# Patient Record
Sex: Female | Born: 1985
Health system: Southern US, Community
[De-identification: ages and names within clinical notes are randomized; demographics above are authoritative.]

## PROBLEM LIST (undated history)

## (undated) ENCOUNTER — Inpatient Hospital Stay (HOSPITAL_COMMUNITY): Payer: Self-pay

## (undated) DIAGNOSIS — R0789 Other chest pain: Secondary | ICD-10-CM

## (undated) DIAGNOSIS — M549 Dorsalgia, unspecified: Secondary | ICD-10-CM

## (undated) DIAGNOSIS — F419 Anxiety disorder, unspecified: Secondary | ICD-10-CM

## (undated) DIAGNOSIS — F319 Bipolar disorder, unspecified: Secondary | ICD-10-CM

## (undated) DIAGNOSIS — E785 Hyperlipidemia, unspecified: Secondary | ICD-10-CM

## (undated) DIAGNOSIS — C801 Malignant (primary) neoplasm, unspecified: Secondary | ICD-10-CM

## (undated) DIAGNOSIS — N390 Urinary tract infection, site not specified: Secondary | ICD-10-CM

## (undated) DIAGNOSIS — T8859XA Other complications of anesthesia, initial encounter: Secondary | ICD-10-CM

## (undated) DIAGNOSIS — J45909 Unspecified asthma, uncomplicated: Secondary | ICD-10-CM

## (undated) DIAGNOSIS — F32A Depression, unspecified: Secondary | ICD-10-CM

## (undated) HISTORY — PX: TONSILLECTOMY: SUR1361

## (undated) HISTORY — DX: Other chest pain: R07.89

## (undated) HISTORY — DX: Hyperlipidemia, unspecified: E78.5

---

## 2015-05-28 ENCOUNTER — Emergency Department (HOSPITAL_COMMUNITY)
Admission: EM | Admit: 2015-05-28 | Discharge: 2015-05-28 | Disposition: A | Discharge status: Home / Self Care | Payer: Self-pay | Attending: Emergency Medicine | Admitting: Emergency Medicine

## 2015-05-28 ENCOUNTER — Encounter (HOSPITAL_COMMUNITY): Payer: Self-pay

## 2015-05-28 DIAGNOSIS — J45909 Unspecified asthma, uncomplicated: Secondary | ICD-10-CM | POA: Insufficient documentation

## 2015-05-28 DIAGNOSIS — F4321 Adjustment disorder with depressed mood: Secondary | ICD-10-CM | POA: Insufficient documentation

## 2015-05-28 DIAGNOSIS — F410 Panic disorder [episodic paroxysmal anxiety] without agoraphobia: Secondary | ICD-10-CM | POA: Insufficient documentation

## 2015-05-28 DIAGNOSIS — Z79899 Other long term (current) drug therapy: Secondary | ICD-10-CM | POA: Insufficient documentation

## 2015-05-28 DIAGNOSIS — F329 Major depressive disorder, single episode, unspecified: Secondary | ICD-10-CM | POA: Insufficient documentation

## 2015-05-28 HISTORY — DX: Dorsalgia, unspecified: M54.9

## 2015-05-28 HISTORY — DX: Unspecified asthma, uncomplicated: J45.909

## 2015-05-28 MED ORDER — ALPRAZOLAM 0.25 MG PO TABS: 0.2500 mg | ORAL_TABLET | Freq: Two times a day (BID) | ORAL | Status: DC | PRN

## 2015-05-28 MED ORDER — ALBUTEROL SULFATE HFA 108 (90 BASE) MCG/ACT IN AERS
2.0000 | INHALATION_SPRAY | Freq: Once | RESPIRATORY_TRACT | Status: AC
  Administered 2015-05-28: 2 via RESPIRATORY_TRACT
  Filled 2015-05-28: qty 6.7

## 2015-05-28 NOTE — Discharge Instructions (Signed)
Take xanax as needed for anxiety.   You should talk to your friends and family and you need social support.   You need to see primary care doctor.   Return to ER if you have worse depression, thoughts of harming yourself or others.  Substance Abuse Treatment Programs  Intensive Outpatient Programs [facility]     [address], [place]       [facility] [place] [address] [place], [place]  [facility]     (Inpatient and outpatient)     [address]           [place] [phone] (Suboxone and Methadone)  [place], [place] [address]      [place] [address] [phone] [place], [place]  [facility] (Outpatient/Inpatient, Chemical)    (insurance only) [phone]             [facility] ([place]) [place], [place]     [facility]     [address]     [place], [place]       [facility] (for caregivers and family) [phone] [address] Kristeen Mans. [place], [place]      Residential Treatment Programs [facility]      [address] [place], [place] [address]  [phone]       [facility] [address] [place], [place] [address] [phone]  [facility] [place]        [phone]       [facility] [phone]  [facility] ([place]             [place], [place] or [place] of [place], [address] [phone]  [facility] Cut Off    [address]      [place], [place]       [facility] [address] [place],  [place]  [place]   [address] [place], [place] [address]     [phone]      Admissions: 8am-3pm M-F  [facility] (RTS) [address] [place], [place]  [facility] Program: Residential Program [phone] Days)   [place], [place] or [phone]     ADAT[facility]: [facility] [place], [place] (Walk in Hours over the weekend or  by referral)  [facility] [place], [place], [place] [address] [phone]  Crisis Mobile: [facility]:  [phone] (for crisis response 24 hours a day) [facility] Hotline:      [phone] Outpatient Psychiatry and Counseling  [facility]: Mobile Crisis Management 24 hours:  [phone]  [facility] of the [employer] sliding scale fee and walk in schedule: M-F 8am-12pm/1pm-3pm [place], [place] [address] [place], [place] [address] [phone]  [facility] ([facility])- new patient walk-in appointments available Monday - Friday 8am -3pm.          [address] [place], [place] [address] [phone] or crisis line- [place] [facility]/ Intensive Outpatient Therapy Program [place], [place] [address] [place]      [phone] [address], [place] [address]                 [place]   [facility] [phone]. [place], [place] [address]   [employer] [facility]          [address] Johnette Abraham  [place], [place] [address]       [phone]  [facility] [address], [place], [place] [address] [phone]  [facility]    [address] [place], [place] [address]     [place], [place] Joycelyn Man     [place] [address]     [phone]       [facility] [place] [address]  [facility]     [address], [place], MD [place], [place] [address] [address]      [address]     [place], [place] [address]     [phone]       [facility] [address] [place], [place] [address] [phone] Resources for Temporary Residential Assistance/Crisis [facility]) M-F 8am-3pm   [address] [place], [place] [address]   [phone] Services include: laundry, barbering, support groups, case management, phone  & computer access, showers, AA/NA mtgs, mental health/substance abuse nurse, job skills class, disability information, VA assistance, spiritual classes, etc.   [facility]   [address] [place]     [place]              [employer] (women and children)       [address] [place], [place] [address] [phone] [email or web address] for application and process Application Required  [facility]   [address]    [place] [address]     [phone]  [place], [place] [address] [phone]([email or web address] application appt.) Application Required  [facility] (women only)    [address]     [place], [place] [address]     [phone]      Intake starts 6pm daily Need valid ID, SSC, & Police report [employer] [address] [place], [place] [phone] Application  Required  [employer] (men only)     [place].      [place], [place]       [place] (Pregnant women only) [address] [place], [place]  [facility]      [place] [address].      [place], [place] [address]     [phone]             [facility] [address] [place], [place] 90 day commitment/SA/Application process  [facility](men only)     [address]     [place], [place]       Check-in at [facility] [address] [place], [place] [address] [phone] Men/Women/Women and Children must be there by 7 pm  [place], [place]
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
## 2015-11-27 ENCOUNTER — Emergency Department (HOSPITAL_COMMUNITY)
Admission: EM | Admit: 2015-11-27 | Discharge: 2015-11-28 | Disposition: A | Discharge status: Home / Self Care | Payer: Managed Care, Other (non HMO) | Attending: Emergency Medicine | Admitting: Emergency Medicine

## 2015-11-27 ENCOUNTER — Emergency Department (HOSPITAL_COMMUNITY): Payer: Managed Care, Other (non HMO)

## 2015-11-27 ENCOUNTER — Encounter (HOSPITAL_COMMUNITY): Payer: Self-pay | Admitting: *Deleted

## 2015-11-27 DIAGNOSIS — R1030 Lower abdominal pain, unspecified: Secondary | ICD-10-CM | POA: Diagnosis not present

## 2015-11-27 DIAGNOSIS — Z79899 Other long term (current) drug therapy: Secondary | ICD-10-CM | POA: Insufficient documentation

## 2015-11-27 DIAGNOSIS — J45909 Unspecified asthma, uncomplicated: Secondary | ICD-10-CM | POA: Insufficient documentation

## 2015-11-27 HISTORY — DX: Malignant (primary) neoplasm, unspecified: C80.1

## 2015-11-27 LAB — URINALYSIS, ROUTINE W REFLEX MICROSCOPIC
Bilirubin Urine: NEGATIVE
GLUCOSE, UA: NEGATIVE mg/dL
Hgb urine dipstick: NEGATIVE
Ketones, ur: NEGATIVE mg/dL
LEUKOCYTES UA: NEGATIVE
Nitrite: NEGATIVE
PH: 6 (ref 5.0–8.0)
PROTEIN: NEGATIVE mg/dL
SPECIFIC GRAVITY, URINE: 1.035 — AB (ref 1.005–1.030)

## 2015-11-27 LAB — COMPREHENSIVE METABOLIC PANEL
ALT: 13 U/L — ABNORMAL LOW (ref 14–54)
AST: 16 U/L (ref 15–41)
Albumin: 4.3 g/dL (ref 3.5–5.0)
Alkaline Phosphatase: 40 U/L (ref 38–126)
Anion gap: 6 (ref 5–15)
BUN: 11 mg/dL (ref 6–20)
CHLORIDE: 107 mmol/L (ref 101–111)
CO2: 23 mmol/L (ref 22–32)
Calcium: 9.5 mg/dL (ref 8.9–10.3)
Creatinine, Ser: 0.81 mg/dL (ref 0.44–1.00)
Glucose, Bld: 92 mg/dL (ref 65–99)
POTASSIUM: 4.2 mmol/L (ref 3.5–5.1)
SODIUM: 136 mmol/L (ref 135–145)
Total Bilirubin: 0.6 mg/dL (ref 0.3–1.2)
Total Protein: 6.7 g/dL (ref 6.5–8.1)

## 2015-11-27 LAB — CBC
HEMATOCRIT: 39 % (ref 36.0–46.0)
HEMOGLOBIN: 13.7 g/dL (ref 12.0–15.0)
MCH: 30.2 pg (ref 26.0–34.0)
MCHC: 35.1 g/dL (ref 30.0–36.0)
MCV: 86.1 fL (ref 78.0–100.0)
PLATELETS: 279 10*3/uL (ref 150–400)
RBC: 4.53 MIL/uL (ref 3.87–5.11)
RDW: 13.2 % (ref 11.5–15.5)
WBC: 12.7 10*3/uL — AB (ref 4.0–10.5)

## 2015-11-27 LAB — I-STAT BETA HCG BLOOD, ED (MC, WL, AP ONLY): I-stat hCG, quantitative: <5 m[IU]/mL (ref <5)

## 2015-11-27 LAB — LIPASE, BLOOD: LIPASE: 36 U/L (ref 11–51)

## 2015-11-27 MED ORDER — IOPAMIDOL (ISOVUE-300) INJECTION 61%
INTRAVENOUS | Status: AC
  Administered 2015-11-27: 100 mL
  Filled 2015-11-27: qty 100

## 2015-11-27 MED ORDER — SODIUM CHLORIDE 0.9 % IV SOLN
1000.0000 mL | INTRAVENOUS | Status: DC
  Administered 2015-11-27: 1000 mL via INTRAVENOUS

## 2015-11-27 MED ORDER — ONDANSETRON HCL 4 MG/2ML IJ SOLN
4.0000 mg | Freq: Once | INTRAMUSCULAR | Status: AC
  Administered 2015-11-27: 4 mg via INTRAVENOUS
  Filled 2015-11-27: qty 2

## 2015-11-27 MED ORDER — SODIUM CHLORIDE 0.9 % IV SOLN
1000.0000 mL | Freq: Once | INTRAVENOUS | Status: AC
  Administered 2015-11-27: 1000 mL via INTRAVENOUS

## 2015-11-27 MED ORDER — MORPHINE SULFATE (PF) 4 MG/ML IV SOLN
4.0000 mg | Freq: Once | INTRAVENOUS | Status: AC
  Administered 2015-11-27: 4 mg via INTRAVENOUS
  Filled 2015-11-27: qty 1

## 2015-11-27 NOTE — ED Provider Notes (Signed)
[facility] DEPT Provider Note   CSN: [id number] Arrival date & time: 11/27/15  1831   By signing my name below, I, Delton Prairie, attest that this documentation has been prepared under the direction and in the presence of Jola Schmidt, MD  Electronically Signed: Delton Prairie, ED Scribe. 11/27/15. 11:36 PM.   History   Chief Complaint Chief Complaint  Patient presents with  . Abdominal Pain   The history is provided by the patient. No language interpreter was used.   HPI Comments:  Chelsea [name] is a 30 y.o. female, with a hx of cervical cancer, who presents to the Emergency Department complaining of gradually worsening lower abdominal pain x several months. Her pain is exacerbated to the touch and she notes intermittent shooting pain. She notes her pain is tolerable when she uses a pillow to support her back. Pt was sent to ED from [facility]. She states her symptoms worsened x 1 month. Pt states she has been having irregular menstrual cycles. She notes intermittent pain with urinating, intermittent pain with intercourse, nausea, vomiting, diarrhea, fevers and diaphoresis. Pt notes her last episode of emesis was at 5 AM today. Partner notes her nausea and vomiting is exacerbated when eating certain foods. She denies vaginal discharge, vaginal itching, upper abdominal pain and hx of abdominal surgery. No alleviating factors noted. Pt denies any other symptoms or complaints at this time.  Past Medical History:  Diagnosis Date  . Asthma   . Back pain     There are no active problems to display for this patient.   Past Surgical History:  Procedure Laterality Date  . TONSILLECTOMY      OB History    No data available       Home Medications    Prior to Admission medications   Medication Sig Start Date End Date Taking? Authorizing Provider  albuterol (PROVENTIL HFA;VENTOLIN HFA) 108 (90 Base) MCG/ACT inhaler Inhale 2 puffs into the lungs every 6 (six) hours as needed for  wheezing or shortness of breath.   Yes Historical Provider, MD  ALPRAZolam (XANAX) 0.25 MG tablet Take 1 tablet (0.25 mg total) by mouth 2 (two) times daily as needed for anxiety. 05/28/15  Yes Drenda Freeze, MD    Family History History reviewed. No pertinent family history.  Social History Social History  Substance Use Topics  . Smoking status: Never Smoker  . Smokeless tobacco: Not on file  . Alcohol use No     Allergies   Morphine and related   Review of Systems Review of Systems  Constitutional: Positive for diaphoresis and fever.  Gastrointestinal: Positive for abdominal pain, diarrhea, nausea and vomiting.  Genitourinary: Positive for dyspareunia and menstrual problem. Negative for vaginal discharge.  10 systems reviewed and all are negative for acute change except as noted in the HPI.  Physical Exam Updated Vital Signs BP 113/59 (BP Location: Left Arm)   Pulse (!) 55   Temp 98.5 F (36.9 C) (Oral)   Resp 19   SpO2 100%   Physical Exam  Constitutional: She is oriented to person, place, and time. She appears well-developed and well-nourished. No distress.  HENT:  Head: Normocephalic and atraumatic.  Eyes: EOM are normal.  Neck: Normal range of motion.  Cardiovascular: Normal rate, regular rhythm and normal heart sounds.   Pulmonary/Chest: Effort normal and breath sounds normal.  Abdominal: Soft. She exhibits no distension. There is no tenderness.  Diffuse lower abdominal tenderness  Musculoskeletal: Normal range of motion.  Neurological:  She is alert and oriented to person, place, and time.  Skin: Skin is warm and dry.  Psychiatric: She has a normal mood and affect. Judgment normal.  Nursing note and vitals reviewed.    ED Treatments / Results  DIAGNOSTIC STUDIES:  Oxygen Saturation is 100% on RA, normal by my interpretation.    COORDINATION OF CARE:  11:24 PM Discussed treatment plan with pt at bedside and pt agreed to plan.  Labs (all labs  ordered are listed, but only abnormal results are displayed) Labs Reviewed  COMPREHENSIVE METABOLIC PANEL - Abnormal; Notable for the following:       Result Value   ALT 13 (*)    All other components within normal limits  CBC - Abnormal; Notable for the following:    WBC 12.7 (*)    All other components within normal limits  URINALYSIS, ROUTINE W REFLEX MICROSCOPIC (NOT AT [facility]) - Abnormal; Notable for the following:    APPearance HAZY (*)    Specific Gravity, Urine 1.035 (*)    All other components within normal limits  LIPASE, BLOOD  I-STAT BETA HCG BLOOD, ED ([facility], [facility], [facility] ONLY)    EKG  EKG Interpretation None       Radiology No results found.  Procedures Procedures (including critical care time)  Medications Ordered in ED Medications - No data to display   Initial Impression / Assessment and Plan / ED Course  I have reviewed the triage vital signs and the nursing notes.  Pertinent labs & imaging results that were available during my care of the patient were reviewed by me and considered in my medical decision making (see chart for details).  Clinical Course    Patient is overall well-appearing.  CT scan without acute pathology.  Given her ongoing lower abdominal pain she'll need follow-up with a gynecologist for workup of lower abdominal and pelvic pain.  I do not think she needs an acute pelvic examination today she will need follow-up, and a full GYN evaluation.  She has no vaginal discharge.  This could represent endometritis.  Final Clinical Impressions(s) / ED Diagnoses   Final diagnoses:  Lower abdominal pain    New Prescriptions New Prescriptions   ONDANSETRON (ZOFRAN ODT) 8 MG DISINTEGRATING TABLET    Take 1 tablet (8 mg total) by mouth every 8 (eight) hours as needed for nausea or vomiting.   I personally performed the services described in this documentation, which was scribed in my presence. The recorded information has been reviewed and is accurate.         Jola Schmidt, MD 11/28/15 0040

## 2015-11-27 NOTE — ED Triage Notes (Signed)
Pt sent here from [facility] md for RLQ and LLQ pain for extended amount of time with n/v/d. Had elevated WBC. No acute distress noted at triage.

## 2015-11-27 NOTE — ED Notes (Signed)
Patient transported to CT scan . 

## 2015-11-28 MED ORDER — ONDANSETRON 8 MG PO TBDP: 8.0000 mg | ORAL_TABLET | Freq: Three times a day (TID) | ORAL | 0 refills | Status: DC | PRN

## 2015-11-28 NOTE — Discharge Instructions (Signed)
Please call a gynecologist for follow-up
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
## 2016-02-09 ENCOUNTER — Emergency Department (HOSPITAL_COMMUNITY)
Admission: EM | Admit: 2016-02-09 | Discharge: 2016-02-10 | Disposition: A | Discharge status: Home / Self Care | Payer: Managed Care, Other (non HMO) | Attending: Emergency Medicine | Admitting: Emergency Medicine

## 2016-02-09 ENCOUNTER — Encounter (HOSPITAL_COMMUNITY): Payer: Self-pay | Admitting: Emergency Medicine

## 2016-02-09 DIAGNOSIS — Z3A01 Less than 8 weeks gestation of pregnancy: Secondary | ICD-10-CM | POA: Diagnosis not present

## 2016-02-09 DIAGNOSIS — R109 Unspecified abdominal pain: Secondary | ICD-10-CM | POA: Insufficient documentation

## 2016-02-09 DIAGNOSIS — Z859 Personal history of malignant neoplasm, unspecified: Secondary | ICD-10-CM | POA: Insufficient documentation

## 2016-02-09 DIAGNOSIS — O0281 Inappropriate change in quantitative human chorionic gonadotropin (hCG) in early pregnancy: Secondary | ICD-10-CM | POA: Diagnosis not present

## 2016-02-09 DIAGNOSIS — O2341 Unspecified infection of urinary tract in pregnancy, first trimester: Secondary | ICD-10-CM | POA: Diagnosis not present

## 2016-02-09 DIAGNOSIS — J45909 Unspecified asthma, uncomplicated: Secondary | ICD-10-CM | POA: Diagnosis not present

## 2016-02-09 DIAGNOSIS — O26899 Other specified pregnancy related conditions, unspecified trimester: Secondary | ICD-10-CM

## 2016-02-09 DIAGNOSIS — O26891 Other specified pregnancy related conditions, first trimester: Secondary | ICD-10-CM | POA: Diagnosis present

## 2016-02-09 LAB — URINALYSIS, ROUTINE W REFLEX MICROSCOPIC
Bilirubin Urine: NEGATIVE
Glucose, UA: NEGATIVE mg/dL
Hgb urine dipstick: NEGATIVE
Ketones, ur: NEGATIVE mg/dL
Nitrite: NEGATIVE
Protein, ur: NEGATIVE mg/dL
SPECIFIC GRAVITY, URINE: 1.01 (ref 1.005–1.030)
pH: 7 (ref 5.0–8.0)

## 2016-02-09 LAB — COMPREHENSIVE METABOLIC PANEL
ALBUMIN: 4.2 g/dL (ref 3.5–5.0)
ALT: 18 U/L (ref 14–54)
AST: 20 U/L (ref 15–41)
Alkaline Phosphatase: 42 U/L (ref 38–126)
Anion gap: 9 (ref 5–15)
BUN: 8 mg/dL (ref 6–20)
CHLORIDE: 104 mmol/L (ref 101–111)
CO2: 22 mmol/L (ref 22–32)
CREATININE: 0.78 mg/dL (ref 0.44–1.00)
Calcium: 9.5 mg/dL (ref 8.9–10.3)
GFR calc Af Amer: >60 mL/min (ref >60)
GLUCOSE: 97 mg/dL (ref 65–99)
POTASSIUM: 3.7 mmol/L (ref 3.5–5.1)
Sodium: 135 mmol/L (ref 135–145)
Total Bilirubin: 0.2 mg/dL — ABNORMAL LOW (ref 0.3–1.2)
Total Protein: 6.7 g/dL (ref 6.5–8.1)

## 2016-02-09 LAB — CBC
HEMATOCRIT: 38 % (ref 36.0–46.0)
Hemoglobin: 13.1 g/dL (ref 12.0–15.0)
MCH: 29.8 pg (ref 26.0–34.0)
MCHC: 34.5 g/dL (ref 30.0–36.0)
MCV: 86.4 fL (ref 78.0–100.0)
PLATELETS: 290 10*3/uL (ref 150–400)
RBC: 4.4 MIL/uL (ref 3.87–5.11)
RDW: 13.3 % (ref 11.5–15.5)
WBC: 12.8 10*3/uL — AB (ref 4.0–10.5)

## 2016-02-09 LAB — I-STAT BETA HCG BLOOD, ED (MC, WL, AP ONLY)

## 2016-02-09 LAB — LIPASE, BLOOD: Lipase: 25 U/L (ref 11–51)

## 2016-02-09 LAB — HCG, QUANTITATIVE, PREGNANCY: HCG, BETA CHAIN, QUANT, S: 35929 m[IU]/mL — AB (ref <5)

## 2016-02-09 MED ORDER — DEXTROSE 5 % IV SOLN
1.0000 g | Freq: Once | INTRAVENOUS | Status: AC
  Administered 2016-02-09: 1 g via INTRAVENOUS
  Filled 2016-02-09: qty 10

## 2016-02-09 MED ORDER — ACETAMINOPHEN 500 MG PO TABS
1000.0000 mg | ORAL_TABLET | Freq: Once | ORAL | Status: AC
  Administered 2016-02-09: 1000 mg via ORAL
  Filled 2016-02-09: qty 2

## 2016-02-09 NOTE — ED Provider Notes (Signed)
[facility] DEPT Provider Note   CSN: [id number] Arrival date & time: 02/09/16  [place]     History   Chief Complaint Chief Complaint  Patient presents with  . Abdominal Pain  . Possible Pregnancy    HPI   Blood pressure 116/67, pulse 74, temperature 98.4 F (36.9 C), temperature source Oral, resp. rate 20, last menstrual period 12/27/2015, SpO2 100 %.  Chelsea [name] is a 31 y.o. female G2P0Complaining of severe, 10 out of 10 lateralizing abdominal pain onset over the last few days. Patient recently discovered she was pregnant after being late for her. In taking a home pregnancy test which is positive. She denies abnormal vaginal discharge, vaginal bleeding, syncope. She endorses urinary frequency with no dysuria, hematuria and multiple episodes of emesis in the a.m. onset several days ago. This is her second pregnancy her first child was born extremely prematurely and did not survive. She had multiple complications in the pregnancy. She states that she is worried because she did realize she was pregnant she drink alcohol she has had an upper respiratory infection she took azithromycin, DayQuil, NyQuil, Hydromet.    Past Medical History:  Diagnosis Date  . Asthma   . Back pain   . Cancer ([place])     There are no active problems to display for this patient.   Past Surgical History:  Procedure Laterality Date  . TONSILLECTOMY      OB History    No data available       Home Medications    Prior to Admission medications   Medication Sig Start Date End Date Taking? Authorizing Provider  ALPRAZolam (XANAX) 0.25 MG tablet Take 1 tablet (0.25 mg total) by mouth 2 (two) times daily as needed for anxiety. Patient not taking: Reported on 02/09/2016 05/28/15   Drenda Freeze, MD  cephALEXin (KEFLEX) 500 MG capsule Take 1 capsule (500 mg total) by mouth 2 (two) times daily. 02/10/16   Jeily Guthridge, PA-C  ondansetron (ZOFRAN ODT) 8 MG disintegrating tablet Take 1 tablet  (8 mg total) by mouth every 8 (eight) hours as needed for nausea or vomiting. Patient not taking: Reported on 02/09/2016 11/28/15   Jola Schmidt, MD    Family History No family history on file.  Social History Social History  Substance Use Topics  . Smoking status: Never Smoker  . Smokeless tobacco: Never Used  . Alcohol use No     Allergies   Morphine and related   Review of Systems Review of Systems  10 systems reviewed and found to be negative, except as noted in the HPI.   Physical Exam Updated Vital Signs BP 133/90   Pulse 73   Temp 98.4 F (36.9 C) (Oral)   Resp 16   LMP 12/27/2015   SpO2 100%   Physical Exam  Constitutional: She is oriented to person, place, and time. She appears well-developed and well-nourished. No distress.  HENT:  Head: Normocephalic and atraumatic.  Mouth/Throat: Oropharynx is clear and moist.  Eyes: Conjunctivae and EOM are normal. Pupils are equal, round, and reactive to light.  Neck: Normal range of motion.  Cardiovascular: Normal rate, regular rhythm and intact distal pulses.   Pulmonary/Chest: Effort normal and breath sounds normal.  Abdominal: Soft. She exhibits no distension and no mass. There is tenderness. There is no rebound and no guarding. No hernia.  Normoactive bowel sounds, tender to palpation in the suprapubic and left lower quadrant with no guarding or rebound.   Musculoskeletal: Normal range  of motion.  Neurological: She is alert and oriented to person, place, and time.  Skin: She is not diaphoretic.  Psychiatric: She has a normal mood and affect.  Nursing note and vitals reviewed.    ED Treatments / Results  Labs (all labs ordered are listed, but only abnormal results are displayed) Labs Reviewed  WET PREP, GENITAL - Abnormal; Notable for the following:       Result Value   Clue Cells Wet Prep HPF POC PRESENT (*)    WBC, Wet Prep HPF POC MANY (*)    All other components within normal limits  COMPREHENSIVE  METABOLIC PANEL - Abnormal; Notable for the following:    Total Bilirubin 0.2 (*)    All other components within normal limits  CBC - Abnormal; Notable for the following:    WBC 12.8 (*)    All other components within normal limits  URINALYSIS, ROUTINE W REFLEX MICROSCOPIC - Abnormal; Notable for the following:    APPearance HAZY (*)    Leukocytes, UA SMALL (*)    Bacteria, UA RARE (*)    Squamous Epithelial / LPF 0-5 (*)    All other components within normal limits  HCG, QUANTITATIVE, PREGNANCY - Abnormal; Notable for the following:    hCG, Beta Chain, Quant, S 35,929 (*)    All other components within normal limits  I-STAT BETA HCG BLOOD, ED ([facility], [facility], [facility] ONLY) - Abnormal; Notable for the following:    I-stat hCG, quantitative >2,000.0 (*)    All other components within normal limits  URINE CULTURE  LIPASE, BLOOD  GC/CHLAMYDIA PROBE AMP ([place]) NOT AT [facility]    EKG  EKG Interpretation None       Radiology US Ob Comp Less 14 Wks  Result Date: 02/10/2016 CLINICAL DATA:  Abdominal pain in first-trimester pregnancy. Right lower quadrant pelvic pain for 2 days. EXAM: OBSTETRIC <14 WK [place] AND TRANSVAGINAL OB US TECHNIQUE: Both transabdominal and transvaginal ultrasound examinations were performed for complete evaluation of the gestation as well as the maternal uterus, adnexal regions, and pelvic cul-de-sac. Transvaginal technique was performed to assess early pregnancy. COMPARISON:  None. FINDINGS: Intrauterine gestational sac: Single Yolk sac:  Visualized. Embryo:  Visualized. Cardiac Activity: Visualized. Heart Rate: 92  bpm CRL:  3.6  mm   6 w   0 d                  [place] EDC: 10/05/2016 Subchorionic hemorrhage:  None visualized. Maternal uterus/adnexae: The right ovary appears normal measuring 4.1 x 2.6 x 3.4 cm. There is a 2.2 cm cyst in the left ovary which is otherwise normal. Small amount of free fluid in the pelvis appears simple fluid. IMPRESSION: 1. Single live intrauterine  pregnancy estimated gestational age [redacted] weeks 0 days for estimated date of delivery 10/05/2016. No subchorionic hemorrhage. 2. Low fetal heart rate at 92 beats per minute, may be due to early gestational age, however continued follow-up is recommended to exclude fetal bradycardia. Electronically Signed   By: Jeb Levering M.D.   On: 02/10/2016 01:10   US Ob Transvaginal  Result Date: 02/10/2016 CLINICAL DATA:  Abdominal pain in first-trimester pregnancy. Right lower quadrant pelvic pain for 2 days. EXAM: OBSTETRIC <14 WK [place] AND TRANSVAGINAL OB US TECHNIQUE: Both transabdominal and transvaginal ultrasound examinations were performed for complete evaluation of the gestation as well as the maternal uterus, adnexal regions, and pelvic cul-de-sac. Transvaginal technique was performed to assess early pregnancy. COMPARISON:  None. FINDINGS:  Intrauterine gestational sac: Single Yolk sac:  Visualized. Embryo:  Visualized. Cardiac Activity: Visualized. Heart Rate: 92  bpm CRL:  3.6  mm   6 w   0 d                  [place] EDC: 10/05/2016 Subchorionic hemorrhage:  None visualized. Maternal uterus/adnexae: The right ovary appears normal measuring 4.1 x 2.6 x 3.4 cm. There is a 2.2 cm cyst in the left ovary which is otherwise normal. Small amount of free fluid in the pelvis appears simple fluid. IMPRESSION: 1. Single live intrauterine pregnancy estimated gestational age [redacted] weeks 0 days for estimated date of delivery 10/05/2016. No subchorionic hemorrhage. 2. Low fetal heart rate at 92 beats per minute, may be due to early gestational age, however continued follow-up is recommended to exclude fetal bradycardia. Electronically Signed   By: Jeb Levering M.D.   On: 02/10/2016 01:10    Procedures Procedures (including critical care time)  Medications Ordered in ED Medications  cefTRIAXone (ROCEPHIN) 1 g in dextrose 5 % 50 mL IVPB (0 g Intravenous Stopped 02/10/16 0031)  acetaminophen (TYLENOL) tablet 1,000 mg (1,000 mg  Oral Given 02/09/16 2312)     Initial Impression / Assessment and Plan / ED Course  I have reviewed the triage vital signs and the nursing notes.  Pertinent labs & imaging results that were available during my care of the patient were reviewed by me and considered in my medical decision making (see chart for details).  Clinical Course    Vitals:   02/09/16 2307 02/09/16 2341 02/09/16 2342 02/10/16 0130  BP: 111/66 102/56  133/90  Pulse: 86  84 73  Resp: 18   16  Temp:      TempSrc:      SpO2: 100%  100% 100%    Medications  cefTRIAXone (ROCEPHIN) 1 g in dextrose 5 % 50 mL IVPB (0 g Intravenous Stopped 02/10/16 0031)  acetaminophen (TYLENOL) tablet 1,000 mg (1,000 mg Oral Given 02/09/16 2312)    Chelsea [name] is 31 y.o. female presenting with  Severe abdominal pain and newly diagnosed pregnancy. Concern for ectopic. Advised patient to remain nothing by mouth we'll perform pelvic exam and obtain ultrasound. Urinalysis with trace leukocytes she does report urinary frequency, will culture urine and treat with Rocephin. Unfortunately, this patient lost her first pregnancy and a very complicated pregnancy that was quite premature.  Ultrasound shows IUP low fetal heart rate of less than 100. She also has clue cells wet prep.  OB/GYN consult Dr. Ronita Hipps appreciated: States that fetal heart rate of 98 at 6 weeks is not significantly abnormal. We have discussed the clue cells on wet prep and he would recommend not treating for bacterial vaginosis and she is not having any symptoms. Urine culture is pending, she received a gram of Rocephin and I will discharge her home with Keflex.  Evaluation does not show pathology that would require ongoing emergent intervention or inpatient treatment. Pt is hemodynamically stable and mentating appropriately. Discussed findings and plan with patient/guardian, who agrees with care plan. All questions answered. Return precautions discussed and outpatient  follow up given.      Final Clinical Impressions(s) / ED Diagnoses   Final diagnoses:  Urinary tract infection in mother during first trimester of pregnancy    New Prescriptions Discharge Medication List as of 02/10/2016  1:59 AM    START taking these medications   Details  cephALEXin (KEFLEX) 500 MG capsule Take 1  capsule (500 mg total) by mouth 2 (two) times daily., Starting Mon 02/10/2016, [employer], PA-C 02/10/16 [place], MD 02/12/16 [phone]

## 2016-02-09 NOTE — ED Triage Notes (Signed)
Pt c/o abdominal pain onset today while at work. Pt denies vaginal discharge, reports approximately 1 month pregnant.

## 2016-02-10 ENCOUNTER — Emergency Department (HOSPITAL_COMMUNITY): Payer: Managed Care, Other (non HMO)

## 2016-02-10 LAB — WET PREP, GENITAL
SPERM: NONE SEEN
Trich, Wet Prep: NONE SEEN
YEAST WET PREP: NONE SEEN

## 2016-02-10 LAB — GC/CHLAMYDIA PROBE AMP (~~LOC~~) NOT AT ARMC
Chlamydia: NEGATIVE
Neisseria Gonorrhea: NEGATIVE

## 2016-02-10 MED ORDER — CEPHALEXIN 500 MG PO CAPS: 500.0000 mg | ORAL_CAPSULE | Freq: Two times a day (BID) | ORAL | 0 refills | Status: DC

## 2016-02-10 NOTE — Discharge Instructions (Signed)
Follow with OB/GYN as soon as possible.  ° °Do NOT take any NSAIDs, such as Aspirin, Motrin, Ibuprofen, Aleve, Naproxen etc. Only take Tylenol for pain. Return to the emergency room  for any severe abdominal pain, increasing vaginal bleeding, passing out or repeated vomiting. °  °Obtain over-the-counter prenatal vitamins. Read the label and make sure that they have at least 400 mcg of folate acid. °  ° ° ° °

## 2016-02-10 NOTE — ED Notes (Signed)
Pt to US.

## 2016-02-11 LAB — URINE CULTURE

## 2016-02-20 ENCOUNTER — Emergency Department (HOSPITAL_COMMUNITY): Payer: Managed Care, Other (non HMO)

## 2016-02-20 ENCOUNTER — Encounter (HOSPITAL_COMMUNITY): Payer: Self-pay | Admitting: Emergency Medicine

## 2016-02-20 ENCOUNTER — Emergency Department (HOSPITAL_COMMUNITY)
Admission: EM | Admit: 2016-02-20 | Discharge: 2016-02-20 | Disposition: A | Discharge status: Home / Self Care | Payer: Managed Care, Other (non HMO) | Attending: Emergency Medicine | Admitting: Emergency Medicine

## 2016-02-20 DIAGNOSIS — Y999 Unspecified external cause status: Secondary | ICD-10-CM | POA: Insufficient documentation

## 2016-02-20 DIAGNOSIS — R1030 Lower abdominal pain, unspecified: Secondary | ICD-10-CM

## 2016-02-20 DIAGNOSIS — Y939 Activity, unspecified: Secondary | ICD-10-CM | POA: Diagnosis not present

## 2016-02-20 DIAGNOSIS — Z859 Personal history of malignant neoplasm, unspecified: Secondary | ICD-10-CM | POA: Insufficient documentation

## 2016-02-20 DIAGNOSIS — J45909 Unspecified asthma, uncomplicated: Secondary | ICD-10-CM | POA: Insufficient documentation

## 2016-02-20 DIAGNOSIS — Z3A08 8 weeks gestation of pregnancy: Secondary | ICD-10-CM | POA: Insufficient documentation

## 2016-02-20 DIAGNOSIS — Y92410 Unspecified street and highway as the place of occurrence of the external cause: Secondary | ICD-10-CM | POA: Insufficient documentation

## 2016-02-20 DIAGNOSIS — Z79899 Other long term (current) drug therapy: Secondary | ICD-10-CM | POA: Diagnosis not present

## 2016-02-20 DIAGNOSIS — Z3491 Encounter for supervision of normal pregnancy, unspecified, first trimester: Secondary | ICD-10-CM

## 2016-02-20 DIAGNOSIS — O9A211 Injury, poisoning and certain other consequences of external causes complicating pregnancy, first trimester: Secondary | ICD-10-CM | POA: Diagnosis present

## 2016-02-20 DIAGNOSIS — O0281 Inappropriate change in quantitative human chorionic gonadotropin (hCG) in early pregnancy: Secondary | ICD-10-CM | POA: Diagnosis not present

## 2016-02-20 LAB — COMPREHENSIVE METABOLIC PANEL
ALT: 14 U/L (ref 14–54)
ANION GAP: 9 (ref 5–15)
AST: 17 U/L (ref 15–41)
Albumin: 4 g/dL (ref 3.5–5.0)
Alkaline Phosphatase: 35 U/L — ABNORMAL LOW (ref 38–126)
BILIRUBIN TOTAL: 0.5 mg/dL (ref 0.3–1.2)
BUN: 8 mg/dL (ref 6–20)
CALCIUM: 9.5 mg/dL (ref 8.9–10.3)
CO2: 23 mmol/L (ref 22–32)
CREATININE: 0.59 mg/dL (ref 0.44–1.00)
Chloride: 103 mmol/L (ref 101–111)
GFR calc Af Amer: >60 mL/min (ref >60)
GFR calc non Af Amer: >60 mL/min (ref >60)
GLUCOSE: 80 mg/dL (ref 65–99)
Potassium: 4.2 mmol/L (ref 3.5–5.1)
SODIUM: 135 mmol/L (ref 135–145)
TOTAL PROTEIN: 6.7 g/dL (ref 6.5–8.1)

## 2016-02-20 LAB — CBC WITH DIFFERENTIAL/PLATELET
BASOS ABS: 0.1 10*3/uL (ref 0.0–0.1)
Basophils Relative: 0 %
EOS ABS: 0 10*3/uL (ref 0.0–0.7)
EOS PCT: 0 %
HCT: 37.9 % (ref 36.0–46.0)
Hemoglobin: 13.2 g/dL (ref 12.0–15.0)
LYMPHS PCT: 14 %
Lymphs Abs: 2.2 10*3/uL (ref 0.7–4.0)
MCH: 30.1 pg (ref 26.0–34.0)
MCHC: 34.8 g/dL (ref 30.0–36.0)
MCV: 86.3 fL (ref 78.0–100.0)
MONO ABS: 0.6 10*3/uL (ref 0.1–1.0)
Monocytes Relative: 4 %
Neutro Abs: 12.9 10*3/uL — ABNORMAL HIGH (ref 1.7–7.7)
Neutrophils Relative %: 82 %
PLATELETS: 241 10*3/uL (ref 150–400)
RBC: 4.39 MIL/uL (ref 3.87–5.11)
RDW: 13.3 % (ref 11.5–15.5)
WBC: 15.8 10*3/uL — ABNORMAL HIGH (ref 4.0–10.5)

## 2016-02-20 LAB — URINALYSIS, ROUTINE W REFLEX MICROSCOPIC
BILIRUBIN URINE: NEGATIVE
GLUCOSE, UA: NEGATIVE mg/dL
HGB URINE DIPSTICK: NEGATIVE
KETONES UR: NEGATIVE mg/dL
LEUKOCYTES UA: NEGATIVE
Nitrite: NEGATIVE
PH: 7 (ref 5.0–8.0)
PROTEIN: NEGATIVE mg/dL
Specific Gravity, Urine: 1.014 (ref 1.005–1.030)

## 2016-02-20 LAB — HCG, QUANTITATIVE, PREGNANCY: hCG, Beta Chain, Quant, S: 152504 m[IU]/mL — ABNORMAL HIGH (ref <5)

## 2016-02-20 MED ORDER — ONDANSETRON 8 MG PO TBDP: 8.0000 mg | ORAL_TABLET | Freq: Three times a day (TID) | ORAL | 0 refills | Status: DC | PRN

## 2016-02-20 MED ORDER — ACETAMINOPHEN 325 MG PO TABS
650.0000 mg | ORAL_TABLET | ORAL | Status: DC | PRN
  Administered 2016-02-20: 650 mg via ORAL
  Filled 2016-02-20: qty 2
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
## 2016-02-20 NOTE — ED Notes (Signed)
Pt departed in NAD.  

## 2016-02-20 NOTE — ED Notes (Signed)
Patient transported to Ultrasound 

## 2016-02-20 NOTE — ED Notes (Signed)
Pt given crackers and gingerale.

## 2016-02-25 ENCOUNTER — Inpatient Hospital Stay (HOSPITAL_COMMUNITY)
Admission: AD | Admit: 2016-02-25 | Discharge: 2016-02-25 | Disposition: A | Discharge status: Home / Self Care | Payer: Managed Care, Other (non HMO) | Source: Ambulatory Visit | Attending: Obstetrics and Gynecology | Admitting: Obstetrics and Gynecology

## 2016-02-25 ENCOUNTER — Encounter (HOSPITAL_COMMUNITY): Payer: Self-pay | Admitting: *Deleted

## 2016-02-25 DIAGNOSIS — Z3A08 8 weeks gestation of pregnancy: Secondary | ICD-10-CM | POA: Diagnosis not present

## 2016-02-25 DIAGNOSIS — R102 Pelvic and perineal pain: Secondary | ICD-10-CM | POA: Diagnosis present

## 2016-02-25 DIAGNOSIS — B373 Candidiasis of vulva and vagina: Secondary | ICD-10-CM | POA: Diagnosis not present

## 2016-02-25 DIAGNOSIS — O98811 Other maternal infectious and parasitic diseases complicating pregnancy, first trimester: Secondary | ICD-10-CM | POA: Diagnosis not present

## 2016-02-25 DIAGNOSIS — B3731 Acute candidiasis of vulva and vagina: Secondary | ICD-10-CM

## 2016-02-25 LAB — WET PREP, GENITAL
Clue Cells Wet Prep HPF POC: NONE SEEN
SPERM: NONE SEEN
TRICH WET PREP: NONE SEEN
YEAST WET PREP: NONE SEEN

## 2016-02-25 LAB — URINALYSIS, ROUTINE W REFLEX MICROSCOPIC
BILIRUBIN URINE: NEGATIVE
Glucose, UA: NEGATIVE mg/dL
Hgb urine dipstick: NEGATIVE
Ketones, ur: NEGATIVE mg/dL
Leukocytes, UA: NEGATIVE
NITRITE: NEGATIVE
PROTEIN: NEGATIVE mg/dL
SPECIFIC GRAVITY, URINE: 1.021 (ref 1.005–1.030)
pH: 6 (ref 5.0–8.0)

## 2016-02-25 MED ORDER — TERCONAZOLE 0.4 % VA CREA: 1.0000 | TOPICAL_CREAM | Freq: Every day | VAGINAL | 0 refills | Status: DC

## 2016-02-25 NOTE — Discharge Instructions (Signed)

## 2016-02-25 NOTE — MAU Note (Signed)
More than certain she has a yeast infection, itching and burning, is miserable.  Noted this morning was having a pulsing pain in LLQ. Was sure if it was related.

## 2016-02-25 NOTE — MAU Provider Note (Signed)
History     CSN: [id number]  Arrival date and time: 02/25/16 1026   First Provider Initiated Contact with Patient 02/25/16 1426      Chief Complaint  Patient presents with  . Vaginal Pain  . Abdominal Pain   G2P0101 @[redacted]w[redacted]d  here with vaginal burning and itching x3 days. She also reports beige lumpy vaginal discharge. She used Monistat-1 at that time and had no relief. She denies any new skin products or soaps. She was recently treated for UTI with Keflex but did not finish. No hx of STI, no new partner.    OB History    Gravida Para Term Preterm AB Living   2 1   1   1    SAB TAB Ectopic Multiple Live Births           1      Past Medical History:  Diagnosis Date  . Asthma   . Back pain   . Cancer [facility])     Past Surgical History:  Procedure Laterality Date  . TONSILLECTOMY      Family History  Problem Relation Age of Onset  . Hypertension Mother   . Hypertension Father     Social History  Substance Use Topics  . Smoking status: Never Smoker  . Smokeless tobacco: Never Used  . Alcohol use No    Allergies:  Allergies  Allergen Reactions  . Morphine And Related     Burning itching     Prescriptions Prior to Admission  Medication Sig Dispense Refill Last Dose  . ALPRAZolam (XANAX) 0.25 MG tablet Take 1 tablet (0.25 mg total) by mouth 2 (two) times daily as needed for anxiety. (Patient not taking: Reported on 02/09/2016) 10 tablet 0 Not Taking at Unknown time  . cephALEXin (KEFLEX) 500 MG capsule Take 1 capsule (500 mg total) by mouth 2 (two) times daily. (Patient not taking: Reported on 02/20/2016) 20 capsule 0 Not Taking at Unknown time  . ondansetron (ZOFRAN ODT) 8 MG disintegrating tablet Take 1 tablet (8 mg total) by mouth every 8 (eight) hours as needed for nausea or vomiting. 10 tablet 0   . Prenatal Vit-Fe Fumarate-FA (PRENATAL MULTIVITAMIN) TABS tablet Take 1 tablet by mouth daily at 12 noon.   02/20/2016 at Unknown time    Review of Systems   Gastrointestinal: Negative for abdominal pain.  Genitourinary: Positive for vaginal discharge and vaginal pain. Negative for vaginal bleeding.   Physical Exam   Blood pressure 116/65, pulse 62, temperature 97.8 F (36.6 C), temperature source Oral, resp. rate 18, height 5\' 3"  (1.6 m), weight 67.1 kg (148 lb), last menstrual period 12/27/2015.  Physical Exam  Nursing note and vitals reviewed. Constitutional: She is oriented to person, place, and time. She appears well-developed and well-nourished. No distress.  HENT:  Head: Normocephalic.  Neck: Normal range of motion.  Cardiovascular: Normal rate.   Respiratory: Effort normal.  Genitourinary:  Genitourinary Comments: External: no lesions, moderate erythema to vulva Vagina: rugated, parous, thin white discharge w/small curds   Musculoskeletal: Normal range of motion.  Neurological: She is alert and oriented to person, place, and time.  Skin: Skin is warm and dry.  Psychiatric: She has a normal mood and affect.   Results for orders placed or performed during the hospital encounter of 02/25/16 (from the past 24 hour(s))  Urinalysis, Routine w reflex microscopic     Status: None   Collection Time: 02/25/16 11:15 AM  Result Value Ref Range   Color, Urine YELLOW YELLOW  APPearance CLEAR CLEAR   Specific Gravity, Urine 1.021 1.005 - 1.030   pH 6.0 5.0 - 8.0   Glucose, UA NEGATIVE NEGATIVE mg/dL   Hgb urine dipstick NEGATIVE NEGATIVE   Bilirubin Urine NEGATIVE NEGATIVE   Ketones, ur NEGATIVE NEGATIVE mg/dL   Protein, ur NEGATIVE NEGATIVE mg/dL   Nitrite NEGATIVE NEGATIVE   Leukocytes, UA NEGATIVE NEGATIVE  Wet prep, genital     Status: Abnormal   Collection Time: 02/25/16  2:38 PM  Result Value Ref Range   Yeast Wet Prep HPF POC NONE SEEN NONE SEEN   Trich, Wet Prep NONE SEEN NONE SEEN   Clue Cells Wet Prep HPF POC NONE SEEN NONE SEEN   WBC, Wet Prep HPF POC FEW (A) NONE SEEN   Sperm NONE SEEN    MAU Course   Procedures  MDM Labs ordered and reviewed. Will treat for yeast based on sx and exam. Stable for discharge home.  Assessment and Plan   1. [redacted] weeks gestation of pregnancy   2. Yeast vaginitis    Discharge home Rx Terazol Follow up with [facility] in 2 wks-start PNC Tub soaks with baking soda  Julianne Handler, CNM 02/25/2016, 2:27 PM

## 2016-02-26 LAB — GC/CHLAMYDIA PROBE AMP (~~LOC~~) NOT AT ARMC
CHLAMYDIA, DNA PROBE: NEGATIVE
Neisseria Gonorrhea: NEGATIVE

## 2016-04-08 DIAGNOSIS — Z8751 Personal history of pre-term labor: Secondary | ICD-10-CM | POA: Insufficient documentation

## 2016-04-08 LAB — OB RESULTS CONSOLE HEPATITIS B SURFACE ANTIGEN: HEP B S AG: NEGATIVE

## 2016-04-08 LAB — OB RESULTS CONSOLE RPR: RPR: NONREACTIVE

## 2016-04-08 LAB — OB RESULTS CONSOLE ABO/RH: RH TYPE: NEGATIVE

## 2016-04-08 LAB — OB RESULTS CONSOLE GC/CHLAMYDIA: Gonorrhea: NEGATIVE

## 2016-04-08 LAB — OB RESULTS CONSOLE ANTIBODY SCREEN: ANTIBODY SCREEN: NEGATIVE

## 2016-04-08 LAB — OB RESULTS CONSOLE HIV ANTIBODY (ROUTINE TESTING): HIV: NONREACTIVE

## 2016-04-08 LAB — OB RESULTS CONSOLE RUBELLA ANTIBODY, IGM: Rubella: IMMUNE

## 2016-05-10 ENCOUNTER — Inpatient Hospital Stay (HOSPITAL_COMMUNITY): Payer: Medicaid Other

## 2016-05-10 ENCOUNTER — Inpatient Hospital Stay (HOSPITAL_COMMUNITY)
Admission: AD | Admit: 2016-05-10 | Discharge: 2016-05-10 | Disposition: A | Discharge status: Home / Self Care | Payer: Medicaid Other | Source: Ambulatory Visit | Attending: Obstetrics and Gynecology | Admitting: Obstetrics and Gynecology

## 2016-05-10 ENCOUNTER — Encounter (HOSPITAL_COMMUNITY): Payer: Self-pay | Admitting: Advanced Practice Midwife

## 2016-05-10 DIAGNOSIS — R197 Diarrhea, unspecified: Secondary | ICD-10-CM | POA: Insufficient documentation

## 2016-05-10 DIAGNOSIS — O26892 Other specified pregnancy related conditions, second trimester: Secondary | ICD-10-CM | POA: Insufficient documentation

## 2016-05-10 DIAGNOSIS — J45909 Unspecified asthma, uncomplicated: Secondary | ICD-10-CM | POA: Insufficient documentation

## 2016-05-10 DIAGNOSIS — R102 Pelvic and perineal pain: Secondary | ICD-10-CM | POA: Insufficient documentation

## 2016-05-10 DIAGNOSIS — Z3A19 19 weeks gestation of pregnancy: Secondary | ICD-10-CM | POA: Insufficient documentation

## 2016-05-10 DIAGNOSIS — O99512 Diseases of the respiratory system complicating pregnancy, second trimester: Secondary | ICD-10-CM | POA: Diagnosis not present

## 2016-05-10 DIAGNOSIS — O9989 Other specified diseases and conditions complicating pregnancy, childbirth and the puerperium: Secondary | ICD-10-CM | POA: Diagnosis not present

## 2016-05-10 DIAGNOSIS — R109 Unspecified abdominal pain: Secondary | ICD-10-CM

## 2016-05-10 LAB — URINALYSIS, ROUTINE W REFLEX MICROSCOPIC
Bilirubin Urine: NEGATIVE
Glucose, UA: 50 mg/dL — AB
Hgb urine dipstick: NEGATIVE
Ketones, ur: NEGATIVE mg/dL
LEUKOCYTES UA: NEGATIVE
NITRITE: NEGATIVE
PH: 7 (ref 5.0–8.0)
Protein, ur: NEGATIVE mg/dL
SPECIFIC GRAVITY, URINE: 1.001 — AB (ref 1.005–1.030)

## 2016-05-10 MED ORDER — SIMETHICONE 80 MG PO CHEW
80.0000 mg | CHEWABLE_TABLET | Freq: Once | ORAL | Status: AC
Start: 2016-05-10 — End: 2016-05-10
  Administered 2016-05-10: 80 mg via ORAL
  Filled 2016-05-10: qty 1

## 2016-05-10 NOTE — Discharge Instructions (Signed)
Second Trimester of Pregnancy The second trimester is from week 14 through week 27 (months 4 through 6). The second trimester is often a time when you feel your best. Your body has adjusted to being pregnant, and you begin to feel better physically. Usually, morning sickness has lessened or quit completely, you may have more energy, and you may have an increase in appetite. The second trimester is also a time when the fetus is growing rapidly. At the end of the sixth month, the fetus is about 9 inches long and weighs about 1 pounds. You will likely begin to feel the baby move (quickening) between 16 and 20 weeks of pregnancy. Body changes during your second trimester Your body continues to go through many changes during your second trimester. The changes vary from woman to woman.  Your weight will continue to increase. You will notice your lower abdomen bulging out.  You may begin to get stretch marks on your hips, abdomen, and breasts.  You may develop headaches that can be relieved by medicines. The medicines should be approved by your health care provider.  You may urinate more often because the fetus is pressing on your bladder.  You may develop or continue to have heartburn as a result of your pregnancy.  You may develop constipation because certain hormones are causing the muscles that push waste through your intestines to slow down.  You may develop hemorrhoids or swollen, bulging veins (varicose veins).  You may have back pain. This is caused by:  Weight gain.  Pregnancy hormones that are relaxing the joints in your pelvis.  A shift in weight and the muscles that support your balance.  Your breasts will continue to grow and they will continue to become tender.  Your gums may bleed and may be sensitive to brushing and flossing.  Dark spots or blotches (chloasma, mask of pregnancy) may develop on your face. This will likely fade after the baby is born.  A dark line from your  belly button to the pubic area (linea nigra) may appear. This will likely fade after the baby is born.  You may have changes in your hair. These can include thickening of your hair, rapid growth, and changes in texture. Some women also have hair loss during or after pregnancy, or hair that feels dry or thin. Your hair will most likely return to normal after your baby is born. What to expect at prenatal visits During a routine prenatal visit:  You will be weighed to make sure you and the fetus are growing normally.  Your blood pressure will be taken.  Your abdomen will be measured to track your baby's growth.  The fetal heartbeat will be listened to.  Any test results from the previous visit will be discussed. Your health care provider may ask you:  How you are feeling.  If you are feeling the baby move.  If you have had any abnormal symptoms, such as leaking fluid, bleeding, severe headaches, or abdominal cramping.  If you are using any tobacco products, including cigarettes, chewing tobacco, and electronic cigarettes.  If you have any questions. Other tests that may be performed during your second trimester include:  Blood tests that check for:  Low iron levels (anemia).  High blood sugar that affects pregnant women (gestational diabetes) between 78 and 28 weeks.  Rh antibodies. This is to check for a protein on red blood cells (Rh factor).  Urine tests to check for infections, diabetes, or protein in the  urine.  An ultrasound to confirm the proper growth and development of the baby.  An amniocentesis to check for possible genetic problems.  Fetal screens for spina bifida and Down syndrome.  HIV (human immunodeficiency virus) testing. Routine prenatal testing includes screening for HIV, unless you choose not to have this test. Follow these instructions at home: Medicines   Follow your health care provider's instructions regarding medicine use. Specific medicines may  be either safe or unsafe to take during pregnancy.  Take a prenatal vitamin that contains at least 600 micrograms (mcg) of folic acid.  If you develop constipation, try taking a stool softener if your health care provider approves. Eating and drinking   Eat a balanced diet that includes fresh fruits and vegetables, whole grains, good sources of protein such as meat, eggs, or tofu, and low-fat dairy. Your health care provider will help you determine the amount of weight gain that is right for you.  Avoid raw meat and uncooked cheese. These carry germs that can cause birth defects in the baby.  If you have low calcium intake from food, talk to your health care provider about whether you should take a daily calcium supplement.  Limit foods that are high in fat and processed sugars, such as fried and sweet foods.  To prevent constipation:  Drink enough fluid to keep your urine clear or pale yellow.  Eat foods that are high in fiber, such as fresh fruits and vegetables, whole grains, and beans. Activity   Exercise only as directed by your health care provider. Most women can continue their usual exercise routine during pregnancy. Try to exercise for 30 minutes at least 5 days a week. Stop exercising if you experience uterine contractions.  Avoid heavy lifting, wear low heel shoes, and practice good posture.  A sexual relationship may be continued unless your health care provider directs you otherwise. Relieving pain and discomfort   Wear a good support bra to prevent discomfort from breast tenderness.  Take warm sitz baths to soothe any pain or discomfort caused by hemorrhoids. Use hemorrhoid cream if your health care provider approves.  Rest with your legs elevated if you have leg cramps or low back pain.  If you develop varicose veins, wear support hose. Elevate your feet for 15 minutes, 3-4 times a day. Limit salt in your diet. Prenatal Care   Write down your questions. Take them  to your prenatal visits.  Keep all your prenatal visits as told by your health care provider. This is important. Safety   Wear your seat belt at all times when driving.  Make a list of emergency phone numbers, including numbers for family, friends, the hospital, and police and fire departments. General instructions   Ask your health care provider for a referral to a local prenatal education class. Begin classes no later than the beginning of month 6 of your pregnancy.  Ask for help if you have counseling or nutritional needs during pregnancy. Your health care provider can offer advice or refer you to specialists for help with various needs.  Do not use hot tubs, steam rooms, or saunas.  Do not douche or use tampons or scented sanitary pads.  Do not cross your legs for long periods of time.  Avoid cat litter boxes and soil used by cats. These carry germs that can cause birth defects in the baby and possibly loss of the fetus by miscarriage or stillbirth.  Avoid all smoking, herbs, alcohol, and unprescribed drugs. Chemicals in  these products can affect the formation and growth of the baby.  Do not use any products that contain nicotine or tobacco, such as cigarettes and e-cigarettes. If you need help quitting, ask your health care provider.  Visit your dentist if you have not gone yet during your pregnancy. Use a soft toothbrush to brush your teeth and be gentle when you floss. Contact a health care provider if:  You have dizziness.  You have mild pelvic cramps, pelvic pressure, or nagging pain in the abdominal area.  You have persistent nausea, vomiting, or diarrhea.  You have a bad smelling vaginal discharge.  You have pain when you urinate. Get help right away if:  You have a fever.  You are leaking fluid from your vagina.  You have spotting or bleeding from your vagina.  You have severe abdominal cramping or pain.  You have rapid weight gain or weight loss.  You  have shortness of breath with chest pain.  You notice sudden or extreme swelling of your face, hands, ankles, feet, or legs.  You have not felt your baby move in over an hour.  You have severe headaches that do not go away when you take medicine.  You have vision changes. Summary  The second trimester is from week 14 through week 27 (months 4 through 6). It is also a time when the fetus is growing rapidly.  Your body goes through many changes during pregnancy. The changes vary from woman to woman.  Avoid all smoking, herbs, alcohol, and unprescribed drugs. These chemicals affect the formation and growth your baby.  Do not use any tobacco products, such as cigarettes, chewing tobacco, and e-cigarettes. If you need help quitting, ask your health care provider.  Contact your health care provider if you have any questions. Keep all prenatal visits as told by your health care provider. This is important. This information is not intended to replace advice given to you by your health care provider. Make sure you discuss any questions you have with your health care provider. Document Released: 01/06/2001 Document Revised: 06/20/2015 Document Reviewed: 03/15/2012 Elsevier Interactive Patient Education  2017 [employer] Viral Gastroenteritis, Adult Viral gastroenteritis is also known as the stomach flu. This condition is caused by various viruses. These viruses can be passed from person to person very easily (are very contagious). This condition may affect your stomach, small intestine, and large intestine. It can cause sudden watery diarrhea, fever, and vomiting. Diarrhea and vomiting can make you feel weak and cause you to become dehydrated. You may not be able to keep fluids down. Dehydration can make you tired and thirsty, cause you to have a dry mouth, and decrease how often you urinate. Older adults and people with other diseases or a weak immune system are at higher risk for dehydration. It  is important to replace the fluids that you lose from diarrhea and vomiting. If you become severely dehydrated, you may need to get fluids through an IV tube. What are the causes? Gastroenteritis is caused by various viruses, including rotavirus and norovirus. Norovirus is the most common cause in adults. You can get sick by eating food, drinking water, or touching a surface contaminated with one of these viruses. You can also get sick from sharing utensils or other personal items with an infected person. What increases the risk? This condition is more likely to develop in people:  Who have a weak defense system (immune system).  Who live with one or more children who  are younger than 24 years old.  Who live in a nursing home.  Who go on cruise ships. What are the signs or symptoms? Symptoms of this condition start suddenly 1-2 days after exposure to a virus. Symptoms may last a few days or as long as a week. The most common symptoms are watery diarrhea and vomiting. Other symptoms include:  Fever.  Headache.  Fatigue.  Pain in the abdomen.  Chills.  Weakness.  Nausea.  Muscle aches.  Loss of appetite. How is this diagnosed? This condition is diagnosed with a medical history and physical exam. You may also have a stool test to check for viruses or other infections. How is this treated? This condition typically goes away on its own. The focus of treatment is to restore lost fluids (rehydration). Your health care provider may recommend that you take an oral rehydration solution (ORS) to replace important salts and minerals (electrolytes) in your body. Severe cases of this condition may require giving fluids through an IV tube. Treatment may also include medicine to help with your symptoms. Follow these instructions at home: Follow instructions from your health care provider about how to care for yourself at home. Eating and drinking  Follow these recommendations as told by your  health care provider:  Take an ORS. This is a drink that is sold at pharmacies and retail stores.  Drink clear fluids in small amounts as you are able. Clear fluids include water, ice chips, diluted fruit juice, and low-calorie sports drinks.  Eat bland, easy-to-digest foods in small amounts as you are able. These foods include bananas, applesauce, rice, lean meats, toast, and crackers.  Avoid fluids that contain a lot of sugar or caffeine, such as energy drinks, sports drinks, and soda.  Avoid alcohol.  Avoid spicy or fatty foods. General instructions    Drink enough fluid to keep your urine clear or pale yellow.  Wash your hands often. If soap and water are not available, use hand sanitizer.  Make sure that all people in your household wash their hands well and often.  Take over-the-counter and prescription medicines only as told by your health care provider.  Rest at home while you recover.  Watch your condition for any changes.  Take a warm bath to relieve any burning or pain from frequent diarrhea episodes.  Keep all follow-up visits as told by your health care provider. This is important. Contact a health care provider if:  You cannot keep fluids down.  Your symptoms get worse.  You have new symptoms.  You feel light-headed or dizzy.  You have muscle cramps. Get help right away if:  You have chest pain.  You feel extremely weak or you faint.  You see blood in your vomit.  Your vomit looks like coffee grounds.  You have bloody or black stools or stools that look like tar.  You have a severe headache, a stiff neck, or both.  You have a rash.  You have severe pain, cramping, or bloating in your abdomen.  You have trouble breathing or you are breathing very quickly.  Your heart is beating very quickly.  Your skin feels cold and clammy.  You feel confused.  You have pain when you urinate.  You have signs of dehydration, such as:  Dark urine,  very little urine, or no urine.  Cracked lips.  Dry mouth.  Sunken eyes.  Sleepiness.  Weakness. This information is not intended to replace advice given to you by your health  care provider. Make sure you discuss any questions you have with your health care provider. Document Released: 01/12/2005 Document Revised: 06/26/2015 Document Reviewed: 09/18/2014 Elsevier Interactive Patient Education  2017 [employer].

## 2016-05-10 NOTE — MAU Note (Signed)
Present to MAU with Stomach pain that started this afternoon.  Feels like pressure in lower belly and lower back pain

## 2016-05-10 NOTE — MAU Provider Note (Signed)
Chief Complaint:  No chief complaint on file.   First Provider Initiated Contact with Patient 05/10/16 1535     HPI: Chelsea [name] is a 31 y.o. G2P0101 at 56w2dwho presents to maternity admissions reporting severe abdominal cramping with pressure and "feeling like something is pushing out".   States was eating a salad just prior to onset of pain.  While here, had watery diarrhea.   Has a history of preterm labor, on Makena She denies LOF, vaginal bleeding, vaginal itching/burning, urinary symptoms, h/a, dizziness, n/v, diarrhea, constipation or fever/chills.   Abdominal Pain  This is a new problem. The current episode started today. The onset quality is sudden. The problem occurs intermittently. The problem has been waxing and waning. The pain is located in the generalized abdominal region and LLQ. The pain is severe. The quality of the pain is colicky, cramping, sharp and a sensation of fullness. The abdominal pain radiates to the back. Associated symptoms include diarrhea. Pertinent negatives include no constipation, dysuria, fever, frequency, headaches, myalgias, nausea or vomiting. The pain is relieved by nothing. She has tried nothing for the symptoms.    RN Note: Present to MAU with Stomach pain that started this afternoon.  Feels like pressure in lower belly and lower back pain  Past Medical History: Past Medical History:  Diagnosis Date  . Asthma   . Back pain   . Cancer [facility])     Past obstetric history: OB History  Gravida Para Term Preterm AB Living  2 1   1   1   SAB TAB Ectopic Multiple Live Births          1    # Outcome Date GA Lbr Len/2nd Weight Sex Delivery Anes PTL Lv  2 Current           1 Preterm 05/20/10    F Vag-Spont  Y LIV      Past Surgical History: Past Surgical History:  Procedure Laterality Date  . TONSILLECTOMY      Family History: Family History  Problem Relation Age of Onset  . Hypertension Mother   . Hypertension Father     Social  History: Social History  Substance Use Topics  . Smoking status: Never Smoker  . Smokeless tobacco: Never Used  . Alcohol use No    Allergies:  Allergies  Allergen Reactions  . Hydromet [Hydrocodone-Homatropine] Itching and Swelling    Patient states that her throat closes.  . Morphine And Related Itching and Other (See Comments)    Causes Burning     Meds:  Prescriptions Prior to Admission  Medication Sig Dispense Refill Last Dose  . acetaminophen (TYLENOL) 160 MG/5ML suspension Take 960 mg by mouth every 6 (six) hours as needed for mild pain.   Past Week at Unknown time  . albuterol (PROVENTIL HFA;VENTOLIN HFA) 108 (90 Base) MCG/ACT inhaler Inhale 1-2 puffs into the lungs every 6 (six) hours as needed for wheezing or shortness of breath.   Past Week at Unknown time  . ondansetron (ZOFRAN ODT) 8 MG disintegrating tablet Take 1 tablet (8 mg total) by mouth every 8 (eight) hours as needed for nausea or vomiting. 10 tablet 0 02/25/2016 at Unknown time  . Prenatal Vit-Fe Fumarate-FA (PRENATAL MULTIVITAMIN) TABS tablet Take 1 tablet by mouth daily at 12 noon.   02/24/2016 at Unknown time  . terconazole (TERAZOL 7) 0.4 % vaginal cream Place 1 applicator vaginally at bedtime. 45 g 0     I have reviewed patient's Past Medical  Hx, Surgical Hx, Family Hx, Social Hx, medications and allergies.   ROS:  Review of Systems  Constitutional: Negative for fever.  Gastrointestinal: Positive for abdominal pain and diarrhea. Negative for constipation, nausea and vomiting.  Genitourinary: Negative for dysuria and frequency.  Musculoskeletal: Negative for myalgias.  Neurological: Negative for headaches.   Other systems negative  Physical Exam  No data found.  Constitutional: Well-developed, well-nourished female who Is crying out and writhing initially.  Went to bathroom, had diarrhea, and seemed less uncomfortable afterward.  Cardiovascular: normal rate and rhythm Respiratory: normal effort,  clear to auscultation bilaterally GI: Abd soft, non-tender, gravid appropriate for gestational age.   No rebound or guarding. MS: Extremities nontender, no edema, normal ROM Neurologic: Alert and oriented x 4.  GU: Neg CVAT.  PELVIC EXAM: Cervix pink, visually closed, without lesion, scant white creamy discharge, vaginal walls and external genitalia normal Bimanual exam: Cervix firm, posterior, neg CMT, uterus nontender, Fundal Height consistent with dates, adnexa without tenderness, enlargement, or mass    FHT:  132   Labs:    Results for orders placed or performed during the hospital encounter of 05/10/16 (from the past 24 hour(s))  Urinalysis, Routine w reflex microscopic     Status: Abnormal   Collection Time: 05/10/16  3:34 PM  Result Value Ref Range   Color, Urine COLORLESS (A) YELLOW   APPearance CLEAR CLEAR   Specific Gravity, Urine 1.001 (L) 1.005 - 1.030   pH 7.0 5.0 - 8.0   Glucose, UA 50 (A) NEGATIVE mg/dL   Hgb urine dipstick NEGATIVE NEGATIVE   Bilirubin Urine NEGATIVE NEGATIVE   Ketones, ur NEGATIVE NEGATIVE mg/dL   Protein, ur NEGATIVE NEGATIVE mg/dL   Nitrite NEGATIVE NEGATIVE   Leukocytes, UA NEGATIVE NEGATIVE     Imaging:  [place] Mfm Ob Limited  Result Date: 05/10/2016 ----------------------------------------------------------------------  OBSTETRICS REPORT                      (Signed Final 05/10/2016 06:06 pm) ---------------------------------------------------------------------- Patient Info  ID #:       [id number]                          D.O.B.:  1985-05-19 (30 yrs)  Name:       Chelsea [name]                   Visit Date: 05/10/2016 04:45 pm ---------------------------------------------------------------------- Performed By  Performed By:     Lesleigh Noe Summer        Referred By:       MAU Nursing-                    RDMS                                      MAU/Triage  Attending:        Kerry Kass        Location:          [facility]                    MD  ---------------------------------------------------------------------- Orders   #  Description  Code   1  [place] MFM OB LIMITED                           [id number]  ----------------------------------------------------------------------   #  Ordered By               Order #        Accession #    Episode #   1  Hansel Feinstein           [id number]      [id number]     [id number]  ---------------------------------------------------------------------- Indications   [redacted] weeks gestation of pregnancy                Z3A.19   Abdominal pain in pregnancy                    O99.89  ---------------------------------------------------------------------- OB History  Gravidity:    2         Prem:   1  Living:       1 ---------------------------------------------------------------------- Fetal Evaluation  Num Of Fetuses:     1  Fetal Heart         159  Rate(bpm):  Cardiac Activity:   Observed  Presentation:       Breech  Placenta:           Posterior, above cervical os  Amniotic Fluid  AFI FV:      Subjectively within normal limits                              Largest Pocket(cm)                              4.0 ---------------------------------------------------------------------- Gestational Age  LMP:           19w 2d        Date:  12/27/15                 EDD:   10/02/16  Best:          19w 2d     Det. By:  LMP  (12/27/15)          EDD:   10/02/16 ---------------------------------------------------------------------- Cervix Uterus Adnexa  Cervix  Length:           4.48  cm. ---------------------------------------------------------------------- Impression  Single IUP at 19w 2d  Limited ultrasound performed due to abdominal pain  A 3.2 x 1.6 cm subchorionic fluid collection is noted near the  fundus - may be more compatible with venous lake than  subchorionic hemorrhage  Posterior placenta without previa  Normal amniotic fluid volume ----------------------------------------------------------------------  Recommendations  Would correlate ultrasound findings with clinical features  Follow up ultrasounds as clinically indicated ----------------------------------------------------------------------                Kerry Kass, MD Electronically Signed Final Report   05/10/2016 06:06 pm ----------------------------------------------------------------------   MAU Course/MDM: I have ordered labs and reviewed results. No evidence for UTI or stones.  Cervix is long.  Suspect pain is related to diarrhea, probable gastroenteritis. Mylicon given with minimal relief, though patient looks better  Consult Dr Harrington Challenger with presentation, exam findings and test results.  Treatments in MAU included Mylicon.    Assessment: 1. Pelvic pain affecting pregnancy in second trimester, antepartum   2.     Colicky  abdominal pain 3.       Diarrhea 4.      Probable gastroenteritis  Plan: Discharge home Preterm Labor precautions and fetal kick counts Follow up in Office for prenatal visits and recheck of status  Encouraged to return here or to other Urgent Care/ED if she develops worsening of symptoms, increase in pain, fever, or other concerning symptoms.   Pt stable at time of discharge.  Hansel Feinstein CNM, MSN Certified Nurse-Midwife 05/10/2016 3:36 PM

## 2016-09-04 LAB — OB RESULTS CONSOLE GBS: STREP GROUP B AG: POSITIVE

## 2016-09-23 ENCOUNTER — Encounter (HOSPITAL_COMMUNITY): Payer: Self-pay

## 2016-09-23 ENCOUNTER — Inpatient Hospital Stay (HOSPITAL_COMMUNITY)
Admission: AD | Admit: 2016-09-23 | Discharge: 2016-09-23 | Disposition: A | Discharge status: Home / Self Care | Payer: Medicaid Other | Source: Ambulatory Visit | Attending: Obstetrics & Gynecology | Admitting: Obstetrics & Gynecology

## 2016-09-23 DIAGNOSIS — O471 False labor at or after 37 completed weeks of gestation: Secondary | ICD-10-CM | POA: Insufficient documentation

## 2016-09-23 DIAGNOSIS — Z3A38 38 weeks gestation of pregnancy: Secondary | ICD-10-CM | POA: Diagnosis not present

## 2016-09-23 NOTE — Discharge Instructions (Signed)
Fetal Movement Counts °Patient Name: ________________________________________________ Patient Due Date: ____________________ °What is a fetal movement count? °A fetal movement count is the number of times that you feel your baby move during a certain amount of time. This may also be called a fetal kick count. A fetal movement count is recommended for every pregnant woman. You may be asked to start counting fetal movements as early as week 28 of your pregnancy. °Pay attention to when your baby is most active. You may notice your baby's sleep and wake cycles. You may also notice things that make your baby move more. You should do a fetal movement count: °· When your baby is normally most active. °· At the same time each day. ° °A good time to count movements is while you are resting, after having something to eat and drink. °How do I count fetal movements? °1. Find a quiet, comfortable area. Sit, or lie down on your side. °2. Write down the date, the start time and stop time, and the number of movements that you felt between those two times. Take this information with you to your health care visits. °3. For 2 hours, count kicks, flutters, swishes, rolls, and jabs. You should feel at least 10 movements during 2 hours. °4. You may stop counting after you have felt 10 movements. °5. If you do not feel 10 movements in 2 hours, have something to eat and drink. Then, keep resting and counting for 1 hour. If you feel at least 4 movements during that hour, you may stop counting. °Contact a health care provider if: °· You feel fewer than 4 movements in 2 hours. °· Your baby is not moving like he or she usually does. °Date: ____________ Start time: ____________ Stop time: ____________ Movements: ____________ °Date: ____________ Start time: ____________ Stop time: ____________ Movements: ____________ °Date: ____________ Start time: ____________ Stop time: ____________ Movements: ____________ °Date: ____________ Start time:  ____________ Stop time: ____________ Movements: ____________ °Date: ____________ Start time: ____________ Stop time: ____________ Movements: ____________ °Date: ____________ Start time: ____________ Stop time: ____________ Movements: ____________ °Date: ____________ Start time: ____________ Stop time: ____________ Movements: ____________ °Date: ____________ Start time: ____________ Stop time: ____________ Movements: ____________ °Date: ____________ Start time: ____________ Stop time: ____________ Movements: ____________ °This information is not intended to replace advice given to you by your health care provider. Make sure you discuss any questions you have with your health care provider. °Document Released: 02/11/2006 Document Revised: 09/11/2015 Document Reviewed: 02/21/2015 °Elsevier Interactive Patient Education © 2018 [employer] °Braxton Hicks Contractions °Contractions of the uterus can occur throughout pregnancy, but they are not always a sign that you are in labor. You may have practice contractions called Braxton Hicks contractions. These false labor contractions are sometimes confused with true labor. °What are Braxton Hicks contractions? °Braxton Hicks contractions are tightening movements that occur in the muscles of the uterus before labor. Unlike true labor contractions, these contractions do not result in opening (dilation) and thinning of the cervix. Toward the end of pregnancy (32-34 weeks), Braxton Hicks contractions can happen more often and may become stronger. These contractions are sometimes difficult to tell apart from true labor because they can be very uncomfortable. You should not feel embarrassed if you go to the hospital with false labor. °Sometimes, the only way to tell if you are in true labor is for your health care provider to look for changes in the cervix. The health care provider will do a physical exam and may monitor your contractions. If   you are not in true labor, the exam  should show that your cervix is not dilating and your water has not broken. °If there are no prenatal problems or other health problems associated with your pregnancy, it is completely safe for you to be sent home with false labor. You may continue to have Braxton Hicks contractions until you go into true labor. °How can I tell the difference between true labor and false labor? °· Differences °? False labor °? Contractions last 30-70 seconds.: Contractions are usually shorter and not as strong as true labor contractions. °? Contractions become very regular.: Contractions are usually irregular. °? Discomfort is usually felt in the top of the uterus, and it spreads to the lower abdomen and low back.: Contractions are often felt in the front of the lower abdomen and in the groin. °? Contractions do not go away with walking.: Contractions may go away when you walk around or change positions while lying down. °? Contractions usually become more intense and increase in frequency.: Contractions get weaker and are shorter-lasting as time goes on. °? The cervix dilates and gets thinner.: The cervix usually does not dilate or become thin. °Follow these instructions at home: °· Take over-the-counter and prescription medicines only as told by your health care provider. °· Keep up with your usual exercises and follow other instructions from your health care provider. °· Eat and drink lightly if you think you are going into labor. °· If Braxton Hicks contractions are making you uncomfortable: °? Change your position from lying down or resting to walking, or change from walking to resting. °? Sit and rest in a tub of warm water. °? Drink enough fluid to keep your urine clear or pale yellow. Dehydration may cause these contractions. °? Do slow and deep breathing several times an hour. °· Keep all follow-up prenatal visits as told by your health care provider. This is important. °Contact a health care provider if: °· You have a  fever. °· You have continuous pain in your abdomen. °Get help right away if: °· Your contractions become stronger, more regular, and closer together. °· You have fluid leaking or gushing from your vagina. °· You pass blood-tinged mucus (bloody show). °· You have bleeding from your vagina. °· You have low back pain that you never had before. °· You feel your baby’s head pushing down and causing pelvic pressure. °· Your baby is not moving inside you as much as it used to. °Summary °· Contractions that occur before labor are called Braxton Hicks contractions, false labor, or practice contractions. °· Braxton Hicks contractions are usually shorter, weaker, farther apart, and less regular than true labor contractions. True labor contractions usually become progressively stronger and regular and they become more frequent. °· Manage discomfort from Braxton Hicks contractions by changing position, resting in a warm bath, drinking plenty of water, or practicing deep breathing. °This information is not intended to replace advice given to you by your health care provider. Make sure you discuss any questions you have with your health care provider. °Document Released: 01/12/2005 Document Revised: 12/02/2015 Document Reviewed: 12/02/2015 °Elsevier Interactive Patient Education © 2017 Else[employer] Inc. ° °

## 2016-09-23 NOTE — MAU Note (Signed)
CTX every 5-7 mins.  No VB/LOF. Last VE 09/18/16 1.5 cm dilated.

## 2016-09-23 NOTE — MAU Note (Signed)
I have communicated with Dr. Alwyn Pea and reviewed vital signs:  Vitals:   09/23/16 2019 09/23/16 2217  BP: 138/77 116/80  Pulse: 97   Resp: 18   Temp: (!) 97.4 F (36.3 C)     Vaginal exam:  Dilation: 1.5 Effacement (%): 50, 60 Cervical Position: Posterior Station: -3 Presentation: Vertex Exam by:: [place] Elaria Osias, RN,   Also reviewed contraction pattern and that non-stress test is reactive.  It has been documented that patient is contracting every 2-6 minutes with no cervical change over 2 hours not indicating active labor.  Patient denies any other complaints.  Based on this report provider has given order for discharge.  A discharge order and diagnosis entered by a provider.   Labor discharge instructions reviewed with patient.

## 2016-09-26 ENCOUNTER — Inpatient Hospital Stay (HOSPITAL_COMMUNITY)
Admission: AD | Admit: 2016-09-26 | Discharge: 2016-09-29 | DRG: 775 | Disposition: A | Discharge status: Home / Self Care | Payer: Medicaid Other | Source: Ambulatory Visit | Attending: Obstetrics and Gynecology | Admitting: Obstetrics and Gynecology

## 2016-09-26 ENCOUNTER — Inpatient Hospital Stay (HOSPITAL_COMMUNITY): Payer: Medicaid Other | Admitting: Anesthesiology

## 2016-09-26 ENCOUNTER — Encounter (HOSPITAL_COMMUNITY): Payer: Self-pay | Admitting: *Deleted

## 2016-09-26 DIAGNOSIS — O99824 Streptococcus B carrier state complicating childbirth: Principal | ICD-10-CM | POA: Diagnosis present

## 2016-09-26 DIAGNOSIS — O26893 Other specified pregnancy related conditions, third trimester: Secondary | ICD-10-CM | POA: Diagnosis present

## 2016-09-26 DIAGNOSIS — Z3A38 38 weeks gestation of pregnancy: Secondary | ICD-10-CM | POA: Diagnosis not present

## 2016-09-26 LAB — CBC
HCT: 38.1 % (ref 36.0–46.0)
HEMOGLOBIN: 13.1 g/dL (ref 12.0–15.0)
MCH: 29.6 pg (ref 26.0–34.0)
MCHC: 34.4 g/dL (ref 30.0–36.0)
MCV: 86 fL (ref 78.0–100.0)
PLATELETS: 189 10*3/uL (ref 150–400)
RBC: 4.43 MIL/uL (ref 3.87–5.11)
RDW: 14.4 % (ref 11.5–15.5)
WBC: 11.4 10*3/uL — AB (ref 4.0–10.5)

## 2016-09-26 MED ORDER — ONDANSETRON HCL 4 MG/2ML IJ SOLN: 4.0000 mg | Freq: Four times a day (QID) | INTRAMUSCULAR | Status: DC | PRN

## 2016-09-26 MED ORDER — LACTATED RINGERS IV SOLN
500.0000 mL | INTRAVENOUS | Status: DC | PRN
  Administered 2016-09-26: 1000 mL via INTRAVENOUS

## 2016-09-26 MED ORDER — VANCOMYCIN HCL IN DEXTROSE 1-5 GM/200ML-% IV SOLN
1000.0000 mg | Freq: Two times a day (BID) | INTRAVENOUS | Status: DC
  Administered 2016-09-26: 1000 mg via INTRAVENOUS
  Filled 2016-09-26 (×2): qty 200

## 2016-09-26 MED ORDER — LACTATED RINGERS IV SOLN
INTRAVENOUS | Status: DC
  Administered 2016-09-26 (×2): via INTRAVENOUS

## 2016-09-26 MED ORDER — FLEET ENEMA 7-19 GM/118ML RE ENEM: 1.0000 | ENEMA | RECTAL | Status: DC | PRN

## 2016-09-26 MED ORDER — EPHEDRINE 5 MG/ML INJ
10.0000 mg | INTRAVENOUS | Status: AC | PRN
  Administered 2016-09-26 (×2): 10 mg via INTRAVENOUS
  Filled 2016-09-26: qty 4

## 2016-09-26 MED ORDER — OXYTOCIN BOLUS FROM INFUSION
500.0000 mL | Freq: Once | INTRAVENOUS | Status: DC
Start: 2016-09-26 — End: 2016-09-27

## 2016-09-26 MED ORDER — PHENYLEPHRINE 40 MCG/ML (10ML) SYRINGE FOR IV PUSH (FOR BLOOD PRESSURE SUPPORT)
80.0000 ug | PREFILLED_SYRINGE | INTRAVENOUS | Status: AC | PRN
  Administered 2016-09-26 (×3): 80 ug via INTRAVENOUS

## 2016-09-26 MED ORDER — SOD CITRATE-CITRIC ACID 500-334 MG/5ML PO SOLN: 30.0000 mL | ORAL | Status: DC | PRN

## 2016-09-26 MED ORDER — OXYTOCIN 40 UNITS IN LACTATED RINGERS INFUSION - SIMPLE MED
1.0000 m[IU]/min | INTRAVENOUS | Status: DC
  Administered 2016-09-27: 66.667 m[IU]/min via INTRAVENOUS

## 2016-09-26 MED ORDER — TERBUTALINE SULFATE 1 MG/ML IJ SOLN
0.2500 mg | Freq: Once | INTRAMUSCULAR | Status: DC | PRN
Start: 2016-09-26 — End: 2016-09-27
  Filled 2016-09-26: qty 1

## 2016-09-26 MED ORDER — DIPHENHYDRAMINE HCL 50 MG/ML IJ SOLN: 12.5000 mg | INTRAMUSCULAR | Status: DC | PRN

## 2016-09-26 MED ORDER — EPHEDRINE 5 MG/ML INJ
10.0000 mg | INTRAVENOUS | Status: DC | PRN
  Filled 2016-09-26 (×2): qty 4
  Filled 2016-09-26: qty 2

## 2016-09-26 MED ORDER — ACETAMINOPHEN 325 MG PO TABS: 650.0000 mg | ORAL_TABLET | ORAL | Status: DC | PRN

## 2016-09-26 MED ORDER — FENTANYL 2.5 MCG/ML BUPIVACAINE 1/10 % EPIDURAL INFUSION (WH - ANES)
14.0000 mL/h | INTRAMUSCULAR | Status: DC | PRN
  Administered 2016-09-26: 14 mL/h via EPIDURAL
  Filled 2016-09-26: qty 100

## 2016-09-26 MED ORDER — PHENYLEPHRINE 40 MCG/ML (10ML) SYRINGE FOR IV PUSH (FOR BLOOD PRESSURE SUPPORT)
80.0000 ug | PREFILLED_SYRINGE | INTRAVENOUS | Status: DC | PRN
  Filled 2016-09-26: qty 10
  Filled 2016-09-26: qty 5
  Filled 2016-09-26: qty 10

## 2016-09-26 MED ORDER — LIDOCAINE HCL (PF) 1 % IJ SOLN
INTRAMUSCULAR | Status: DC | PRN
  Administered 2016-09-26: 3 mL via EPIDURAL
  Administered 2016-09-26 (×2): 5 mL via EPIDURAL

## 2016-09-26 MED ORDER — LACTATED RINGERS IV SOLN: 500.0000 mL | Freq: Once | INTRAVENOUS | Status: DC

## 2016-09-26 MED ORDER — OXYTOCIN 40 UNITS IN LACTATED RINGERS INFUSION - SIMPLE MED
2.5000 [IU]/h | INTRAVENOUS | Status: DC
  Filled 2016-09-26: qty 1000

## 2016-09-26 MED ORDER — LIDOCAINE HCL (PF) 1 % IJ SOLN
30.0000 mL | INTRAMUSCULAR | Status: DC | PRN
  Filled 2016-09-26: qty 30

## 2016-09-26 NOTE — MAU Note (Signed)
Contraction on and off for several days. Much more stronger and closer together today. Reports some bloody show and mucus plug came out

## 2016-09-26 NOTE — Anesthesia Procedure Notes (Signed)
Epidural Patient location during procedure: OB Start time: 09/26/2016 4:40 PM End time: 09/26/2016 4:50 PM  Staffing Anesthesiologist: Joyann Spidle  Preanesthetic Checklist Completed: patient identified, site marked, surgical consent, pre-op evaluation, timeout performed, IV checked, risks and benefits discussed and monitors and equipment checked  Epidural Patient position: sitting Prep: site prepped and draped and DuraPrep Patient monitoring: continuous pulse ox and blood pressure Approach: midline Location: L3-L4 Injection technique: LOR air  Needle:  Needle type: Tuohy  Needle gauge: 17 G Needle length: 9 cm and 9 Needle insertion depth: 6 cm Catheter type: closed end flexible Catheter size: 19 Gauge Catheter at skin depth: 11 cm Test dose: negative  Assessment Events: blood not aspirated, injection not painful, no injection resistance, negative IV test and no paresthesia

## 2016-09-26 NOTE — Anesthesia Preprocedure Evaluation (Signed)

## 2016-09-27 ENCOUNTER — Encounter (HOSPITAL_COMMUNITY): Payer: Self-pay | Admitting: *Deleted

## 2016-09-27 LAB — CBC
HEMATOCRIT: 29.5 % — AB (ref 36.0–46.0)
HEMOGLOBIN: 10.3 g/dL — AB (ref 12.0–15.0)
MCH: 30 pg (ref 26.0–34.0)
MCHC: 34.9 g/dL (ref 30.0–36.0)
MCV: 86 fL (ref 78.0–100.0)
PLATELETS: 157 10*3/uL (ref 150–400)
RBC: 3.43 MIL/uL — ABNORMAL LOW (ref 3.87–5.11)
RDW: 14.3 % (ref 11.5–15.5)
WBC: 15.5 10*3/uL — ABNORMAL HIGH (ref 4.0–10.5)

## 2016-09-27 LAB — RPR: RPR: NONREACTIVE

## 2016-09-27 MED ORDER — OXYCODONE-ACETAMINOPHEN 5-325 MG PO TABS: 2.0000 | ORAL_TABLET | ORAL | Status: DC | PRN

## 2016-09-27 MED ORDER — OXYCODONE-ACETAMINOPHEN 5-325 MG PO TABS: 1.0000 | ORAL_TABLET | ORAL | Status: DC | PRN

## 2016-09-27 MED ORDER — SIMETHICONE 80 MG PO CHEW: 80.0000 mg | CHEWABLE_TABLET | ORAL | Status: DC | PRN

## 2016-09-27 MED ORDER — DIPHENHYDRAMINE HCL 25 MG PO CAPS: 25.0000 mg | ORAL_CAPSULE | Freq: Four times a day (QID) | ORAL | Status: DC | PRN

## 2016-09-27 MED ORDER — COCONUT OIL OIL
1.0000 "application " | TOPICAL_OIL | Status: DC | PRN
  Administered 2016-09-28: 1 via TOPICAL
  Filled 2016-09-27: qty 120

## 2016-09-27 MED ORDER — PRENATAL MULTIVITAMIN CH
1.0000 | ORAL_TABLET | Freq: Every day | ORAL | Status: DC
  Administered 2016-09-27 – 2016-09-29 (×3): 1 via ORAL
  Filled 2016-09-27 (×3): qty 1

## 2016-09-27 MED ORDER — ZOLPIDEM TARTRATE 5 MG PO TABS
5.0000 mg | ORAL_TABLET | Freq: Every evening | ORAL | Status: DC | PRN
  Administered 2016-09-27 – 2016-09-28 (×2): 5 mg via ORAL
  Filled 2016-09-27 (×2): qty 1

## 2016-09-27 MED ORDER — HYDROCODONE-ACETAMINOPHEN 5-325 MG PO TABS
1.0000 | ORAL_TABLET | Freq: Four times a day (QID) | ORAL | Status: DC | PRN
  Administered 2016-09-27 – 2016-09-29 (×4): 2 via ORAL
  Filled 2016-09-27 (×4): qty 2

## 2016-09-27 MED ORDER — IBUPROFEN 600 MG PO TABS
600.0000 mg | ORAL_TABLET | Freq: Four times a day (QID) | ORAL | Status: DC
  Administered 2016-09-27 – 2016-09-29 (×11): 600 mg via ORAL
  Filled 2016-09-27 (×11): qty 1

## 2016-09-27 MED ORDER — SENNOSIDES-DOCUSATE SODIUM 8.6-50 MG PO TABS
2.0000 | ORAL_TABLET | ORAL | Status: DC
  Administered 2016-09-27 – 2016-09-29 (×2): 2 via ORAL
  Filled 2016-09-27 (×2): qty 2

## 2016-09-27 MED ORDER — DIBUCAINE 1 % RE OINT: 1.0000 "application " | TOPICAL_OINTMENT | RECTAL | Status: DC | PRN

## 2016-09-27 MED ORDER — ACETAMINOPHEN 325 MG PO TABS
650.0000 mg | ORAL_TABLET | ORAL | Status: DC | PRN
  Administered 2016-09-27: 650 mg via ORAL
  Filled 2016-09-27: qty 2

## 2016-09-27 MED ORDER — ONDANSETRON HCL 4 MG PO TABS: 4.0000 mg | ORAL_TABLET | ORAL | Status: DC | PRN

## 2016-09-27 MED ORDER — CEFAZOLIN SODIUM-DEXTROSE 1-4 GM/50ML-% IV SOLN: 1.0000 g | Freq: Three times a day (TID) | INTRAVENOUS | Status: DC

## 2016-09-27 MED ORDER — WITCH HAZEL-GLYCERIN EX PADS: 1.0000 "application " | MEDICATED_PAD | CUTANEOUS | Status: DC | PRN

## 2016-09-27 MED ORDER — ONDANSETRON HCL 4 MG/2ML IJ SOLN: 4.0000 mg | INTRAMUSCULAR | Status: DC | PRN

## 2016-09-27 MED ORDER — CEFAZOLIN SODIUM-DEXTROSE 2-4 GM/100ML-% IV SOLN: 2.0000 g | Freq: Once | INTRAVENOUS | Status: DC

## 2016-09-27 MED ORDER — BENZOCAINE-MENTHOL 20-0.5 % EX AERO
1.0000 "application " | INHALATION_SPRAY | CUTANEOUS | Status: DC | PRN
  Administered 2016-09-27 – 2016-09-28 (×2): 1 via TOPICAL
  Filled 2016-09-27 (×2): qty 56

## 2016-09-27 MED ORDER — TETANUS-DIPHTH-ACELL PERTUSSIS 5-2.5-18.5 LF-MCG/0.5 IM SUSP: 0.5000 mL | Freq: Once | INTRAMUSCULAR | Status: DC

## 2016-09-27 NOTE — Progress Notes (Signed)
Patient is eating, ambulating, voiding.  Pain control is good.  Appropriate lochia, no complaints.  Vitals:   09/27/16 0330 09/27/16 0415 09/27/16 0451 09/27/16 0500  BP: 117/69 118/72 110/60 120/69  Pulse: 99 90 98 100  Resp: 20  18 18   Temp:   99.1 F (37.3 C) 98.4 F (36.9 C)  TempSrc:   Oral Axillary  SpO2:   99% 100%  Weight:      Height:        Fundus firm Perineum without swelling. No CT  Lab Results  Component Value Date   WBC 15.5 (H) 09/27/2016   HGB 10.3 (L) 09/27/2016   HCT 29.5 (L) 09/27/2016   MCV 86.0 09/27/2016   PLT 157 09/27/2016    --/--/B NEG (09/01 1605)  A/P Post partum day 0. Doing well.  Routine care.     Allyn Kenner

## 2016-09-27 NOTE — Anesthesia Postprocedure Evaluation (Signed)
Anesthesia Post Note  Patient: Chelsea [name]  Procedure(s) Performed: * No procedures listed *     Patient location during evaluation: Mother Baby Anesthesia Type: Epidural Level of consciousness: awake and alert and oriented Pain management: satisfactory to patient Vital Signs Assessment: post-procedure vital signs reviewed and stable Respiratory status: spontaneous breathing and nonlabored ventilation Cardiovascular status: stable Postop Assessment: no headache, no backache, no signs of nausea or vomiting, adequate PO intake and patient able to bend at knees (patient up walking) Anesthetic complications: no    Last Vitals:  Vitals:   09/27/16 0451 09/27/16 0500  BP: 110/60 120/69  Pulse: 98 100  Resp: 18 18  Temp: 37.3 C 36.9 C  SpO2: 99% 100%    Last Pain:  Vitals:   09/27/16 0550  TempSrc:   PainSc: 5    Pain Goal:                 Willa Rough

## 2016-09-27 NOTE — Progress Notes (Signed)
Pt is  G2P2 s/p SVD at 0226.  Pt is ambulating on her own, has voided and is moving around the room.  Pt rates pain at 7 in vagina, medicated spray, ice and comfort measures discussed.  Pt has no other needs at this time.

## 2016-09-27 NOTE — H&P (Signed)
31 y.o. [redacted]w[redacted]d  G2P0101 comes in c/o ctx.  Otherwise has good fetal movement and no bleeding.  Past Medical History:  Diagnosis Date  . Asthma   . Back pain   . Cancer [facility])     Past Surgical History:  Procedure Laterality Date  . TONSILLECTOMY      OB History  Gravida Para Term Preterm AB Living  2 1   1   1   SAB TAB Ectopic Multiple Live Births          1    # Outcome Date GA Lbr Len/2nd Weight Sex Delivery Anes PTL Lv  2 Current           1 Preterm 05/20/10   2.268 kg (5 lb) F  EPI Y LIV      Social History   Social History  . Marital status: Married    Spouse name: N/A  . Number of children: N/A  . Years of education: N/A   Occupational History  . Not on file.   Social History Main Topics  . Smoking status: Never Smoker  . Smokeless tobacco: Never Used  . Alcohol use No  . Drug use: No  . Sexual activity: Not on file   Other Topics Concern  . Not on file   Social History Narrative  . No narrative on file   Hydromet [hydrocodone-homatropine]; Morphine and related; and Penicillins    Prenatal Transfer Tool  Maternal Diabetes: No Genetic Screening: Normal Maternal Ultrasounds/Referrals: Normal Fetal Ultrasounds or other Referrals:  None Maternal Substance Abuse:  No Significant Maternal Medications:  None Significant Maternal Lab Results: Lab values include: Group B Strep positive  Other PNC: h/o 36week delivery with 17-OHP injections weekly this pregnancy    Vitals:   09/27/16 0137 09/27/16 0209  BP: 123/68 133/75  Pulse: 86 (!) 127  Resp: 20 20  Temp:    SpO2:       Lungs/Cor:  NAD Abdomen:  soft, gravid Ex:  no cords, erythema SVE:  3/70/-2 FHTs:  120, good STV, NST R at admission Toco:  q2-5   A/P   Admitted for labor  GBS Pos- pr pt had throat itching with PCN, sensitities not done, Vancomycin abx prophylaxis Other routine care  [place], [facility]

## 2016-09-28 MED ORDER — RHO D IMMUNE GLOBULIN 1500 UNIT/2ML IJ SOSY
300.0000 ug | PREFILLED_SYRINGE | Freq: Once | INTRAMUSCULAR | Status: AC
  Administered 2016-09-28: 300 ug via INTRAMUSCULAR
  Filled 2016-09-28: qty 2

## 2016-09-28 NOTE — Lactation Note (Signed)
This note was copied from a baby's chart. Lactation Consultation Note  Patient Name: Chelsea [name] Date: 09/28/2016   Mother currently uncomfortable.  RN in room. Mother is pumping and breastfeeding.  Suggest due to infant's size baby feed q 3 hours. LC will return later.     Maternal Data    Feeding    LATCH Score                   Interventions    Lactation Tools Discussed/Used     Consult Status      Carlye Grippe 09/28/2016, 7:37 PM

## 2016-09-28 NOTE — Lactation Note (Signed)
This note was copied from a baby's chart. Lactation Consultation Note Moms 2nd baby. Mom BF her 1st child for 6 months w/o difficulty. Mom didn't have issues latching. Mom had abundant supply of BM. Mom pumped and bottle fed a lot as well.  Mom has short shaft nipples that flatten when compressed. Hand expression taught w/colsotrum noted. Assisted in latching. LC discussed NS fitted #16 NS. Taught application w/mom teach back and demonstrated.  Educated importance of STS, I&O, supply and demand. Mom encouraged to feed baby 8-12 times/24 hours and with feeding cues. [place] brochure given w/resources, support groups and [place] services. Patient Name: Girl Samariah Frazier Today's Date: 09/28/2016 Reason for consult: Initial assessment   Maternal Data Has patient been taught Hand Expression?: Yes Does the patient have breastfeeding experience prior to this delivery?: Yes  Feeding Feeding Type: Breast Fed Length of feed: 0 min  LATCH Score Latch: Too sleepy or reluctant, no latch achieved, no sucking elicited.     Type of Nipple: Flat  Comfort (Breast/Nipple): Filling, red/small blisters or bruises, mild/mod discomfort  Hold (Positioning): Assistance needed to correctly position infant at breast and maintain latch.     Interventions Interventions: Breast feeding basics reviewed;Support pillows;Assisted with latch;Position options;Skin to skin;Breast massage;Hand express;Shells;Pre-pump if needed;Hand pump;Breast compression;Adjust position  Lactation Tools Discussed/Used Tools: Shells;Pump Shell Type: Inverted Breast pump type: Manual Pump Review: Setup, frequency, and cleaning;Milk Storage Initiated by:: Allayne Stack RN IBCLC Date initiated:: 09/28/16   Consult Status Consult Status: Follow-up Date: 09/28/16 Follow-up type: In-patient    Theodoro Kalata 09/28/2016, 3:38 AM

## 2016-09-28 NOTE — Progress Notes (Signed)
Post Partum Day 1 Subjective: up ad lib, voiding, tolerating PO, + flatus and having contraction pain  Objective: Blood pressure 108/65, pulse 64, temperature 98.2 F (36.8 C), temperature source Oral, resp. rate 18, height 5\' 3"  (1.6 m), weight 78.5 kg (173 lb), last menstrual period 12/27/2015, SpO2 100 %, unknown if currently breastfeeding.  Physical Exam:  General: alert, cooperative and no distress Lochia: appropriate Uterine Fundus: firm perineum: healing well, no significant drainage, no dehiscence DVT Evaluation: No evidence of DVT seen on physical exam. Negative Homan's sign. No cords or calf tenderness. No significant calf/ankle edema.   Recent Labs  09/26/16 1605 09/27/16 0606  HGB 13.1 10.3*  HCT 38.1 29.5*    Assessment/Plan: Plan for discharge tomorrow and Breastfeeding  Percocet prn severe pain   LOS: 2 days   Neal Trulson, [place] 09/28/2016, 7:04 AM

## 2016-09-29 LAB — RH IG WORKUP (INCLUDES ABO/RH)
ABO/RH(D): B NEG
Fetal Screen: NEGATIVE
GESTATIONAL AGE(WKS): 38.6
UNIT DIVISION: 0

## 2016-09-29 MED ORDER — IBUPROFEN 600 MG PO TABS: 600.0000 mg | ORAL_TABLET | Freq: Four times a day (QID) | ORAL | 0 refills | Status: DC | PRN

## 2016-09-29 MED ORDER — HYDROCODONE-ACETAMINOPHEN 5-325 MG PO TABS: ORAL_TABLET | ORAL | 0 refills | Status: DC

## 2016-09-29 MED ORDER — DOCUSATE SODIUM 100 MG PO CAPS: 100.0000 mg | ORAL_CAPSULE | Freq: Two times a day (BID) | ORAL | 0 refills | Status: DC

## 2016-09-29 NOTE — Progress Notes (Signed)
Discharge education complete, discharge instructions and follow up appointment discussed. Patient verbalized understanding. 

## 2016-09-29 NOTE — Lactation Note (Addendum)
This note was copied from a baby's chart. Lactation Consultation Note  Patient Name: Chelsea [name] Date: 09/29/2016 Reason for consult: Follow-up assessment;Infant < 6lbs;Engorgement  Baby 39 hours old with 9% weight loss. Mom reports that baby was nursing well yesterday and through the night, and she has been latching baby directly to the breast--no longer needing NS. However, mom reports that her breasts are larger and full and she is not able to get milk to flow. Discussed engorgement prevention/treatment and provided mom with ice. Enc mom to ice both sides of breasts, 10-15 minutes each, and then post-pump using manual pump. Mom given #27 flange d/t for better fit. Enc mom to latch baby after breasts are softened. Enc mom to call for assistance as needed, and mom has this LC's extension. Nira Conn, RN in the room and aware of plan.   Maternal Data    Feeding Feeding Type: Breast Milk Length of feed: 0 min  LATCH Score                   Interventions Interventions: Ice  Lactation Tools Discussed/Used     Consult Status Consult Status: Follow-up Date: 09/29/16 Follow-up type: In-patient    Andres Labrum 09/29/2016, 9:56 AM

## 2016-09-29 NOTE — Discharge Summary (Signed)
Obstetric Discharge Summary Reason for Admission: onset of labor Prenatal Procedures: ultrasound Intrapartum Procedures: spontaneous vaginal delivery Postpartum Procedures: none Complications-Operative and Postpartum: 2nd degree perineal laceration Hemoglobin  Date Value Ref Range Status  09/27/2016 10.3 (L) 12.0 - 15.0 g/dL Final    Comment:    DELTA CHECK NOTED REPEATED TO VERIFY    HCT  Date Value Ref Range Status  09/27/2016 29.5 (L) 36.0 - 46.0 % Final    Physical Exam:  General: alert, cooperative and appears stated age 31: appropriate Uterine Fundus: firm Incision: healing well DVT Evaluation: No evidence of DVT seen on physical exam.  Discharge Diagnoses: Term Pregnancy-delivered  Discharge Information: Date: 09/29/2016 Activity: pelvic rest Diet: routine Medications: Ibuprofen, Colace and Percocet Condition: improved Instructions: refer to practice specific booklet Discharge to: home Follow-up Information    Allyn Kenner, DO Follow up in 4 week(s).   Specialty:  Obstetrics and Gynecology Why:  For a postpartum evaluation Contact information: [address] [place] [address] [phone]           Newborn Data: Live born female  Birth Weight: 6 lb 6.3 oz (2900 g) APGAR: 8, 9  Home with mother.  Chelsea Gibas H. 09/29/2016, 8:33 AM

## 2016-09-29 NOTE — Lactation Note (Signed)
This note was copied from a baby's chart. Lactation Consultation Note  Patient Name: Chelsea [name] Date: 09/29/2016 Reason for consult: Follow-up assessment;Engorgement  Baby 24 hours old. Mom requested assistance with latching. However, mom had not iced again or hand expressed to soften breasts. Assisted mom with hand expression, positioning with pillows and an attempt to latch in cross-cradle position to left breast. Baby willing to open wide and attempt to latch, but mom's breast full and tight, and non-compressible. Mom had EBM at bedside from earlier, so enc mom to use manual pump while this LC bottle-fed--mom's choice, mom had personal Dr. Saul Fordyce bottle at bedside--baby 10 ml of EBM. Mom able to express an additional 7 ml of EBM, which was also given to baby by bottle. Made fresh ice packs for mom and enc mom to ice, hand express and then use DEBP (personal) as desired. Enc mom to continue to do this until breast soft enough for baby to latch. Discussed supply and demand and how engorgement can impact supply.   Plan is for mom to put baby to breast with cues, then supplement with EBM as needed and reviewed amounts and EBM storage guidelines. Enc keeping breast softened, and icing and expressing just prior to latching. Discussed assessment and interventions with Nira Conn, RN.    Maternal Data    Feeding Feeding Type: Breast Milk Length of feed: 0 min  LATCH Score Latch: Too sleepy or reluctant, no latch achieved, no sucking elicited. (Mom's breast engorged--non-compressible. )  Audible Swallowing: None  Type of Nipple: Flat  Comfort (Breast/Nipple): Filling, red/small blisters or bruises, mild/mod discomfort  Hold (Positioning): Assistance needed to correctly position infant at breast and maintain latch.  LATCH Score: 3  Interventions Interventions: Breast feeding basics reviewed;Assisted with latch;Pre-pump if needed;Hand express;Adjust position;Support  pillows;Position options;Expressed milk  Lactation Tools Discussed/Used Tools: Pump Breast pump type: Manual   Consult Status Consult Status: Follow-up Date: 09/30/16 Follow-up type: In-patient    Andres Labrum 09/29/2016, 2:48 PM

## 2016-09-29 NOTE — Progress Notes (Signed)
Infant will likely be staying as a baby patient. Prescriptions for Vicodin, colace, and Ibuprofen given to significant other to be filled and brought back to the hospital.

## 2016-09-30 ENCOUNTER — Ambulatory Visit: Payer: Self-pay

## 2016-09-30 LAB — TYPE AND SCREEN
ABO/RH(D): B NEG
Antibody Screen: POSITIVE
DAT, IgG: NEGATIVE
UNIT DIVISION: 0
Unit division: 0

## 2016-09-30 LAB — BPAM RBC
Blood Product Expiration Date: 201809252359
Blood Product Expiration Date: 201809272359
Unit Type and Rh: 9500
Unit Type and Rh: 9500

## 2016-09-30 NOTE — Lactation Note (Signed)
This note was copied from a baby's chart. Lactation Consultation Note Mom crying, engorged. Breast hard w/knots. RN set up DEBP. Mom has pumped and took shower to release milk. FOB had ice on breast at intervals. Had been 2 hrs since mom pumped. Got mom positioned w/DEBP. LC massaged breast to assist in release of milk. W/2 rounds of pumping, mom pumped 80 ml. RN had printed milk labels, put milk in ref.  Laid mom flat, applied cabbage w/mess wrap to hold in place. ICE. Asked FOB to move around the breast having ICE ion breast for 20 min. Total. Mom is to lay flat for 1 hr. Then after 2 hrs from last pump, mom is to pump again.  Mom has short shaft nipples prior to engorgement. Now no way baby can latch. Asked mom if she had tried NS. Mom stated yes that the baby wouldn't BF w/NS. LC will encouraged mom to use NS later after mom has pain under control. FOB upset that mom has gotten engorged, felt that someone should had relieved her before now.  Mom had been using her DEBP as well as hand pump. RN brought hospital DEBP this evening when RN found out mom was engorged.   Patient Name: Girl Azalea Kyung Rudd Date: 09/30/2016 Reason for consult: Follow-up assessment;Engorgement;Infant weight loss   Maternal Data    Feeding Feeding Type: Bottle Fed - Breast Milk  LATCH Score       Type of Nipple: Flat  Comfort (Breast/Nipple): Engorged, cracked, bleeding, large blisters, severe discomfort  Hold (Positioning): Full assist, staff holds infant at breast     Interventions Interventions: Support pillows;Expressed milk;Breast massage;DEBP;Breast compression;Ice (cabbage)  Lactation Tools Discussed/Used Tools: Pump Breast pump type: Double-Electric Breast Pump Date initiated:: 09/30/16   Consult Status Consult Status: Follow-up Date: 09/30/16 Follow-up type: In-patient    Falan Hensler, Elta [place] 09/30/2016, 1:20 AM

## 2016-09-30 NOTE — Lactation Note (Addendum)
This note was copied from a baby's chart. Lactation Consultation Note  Patient Name: Chelsea [name] Date: 09/30/2016 Reason for consult: Follow-up assessment   Baby 35 hours old and mother has been engorged. Mother pumping L breast while holding ice pack on R breast. Suggest icing and then pumping both breasts with DEBP for efficiency purposes. Noted abrasion on R nipple.  Mother states her nipples are still sore but will attempt breastfeeding w/ next feeding. She is using coconut oil for soreness. Suggest trying with NS.  Mother's nipples have been flat but are more evert after pumping.   Recommend if not latching to pump q 2.5-3 hours.  Hand express before and after pumping. Mother has been pumping good volume of breastmilk and giving it to baby w/ slow flow nipple. Discussed not taking hot showers for engorgement but use cold instead. Had mother lie flat in the bed and did reverse pressure softening. Then applied ice packs to top and bottom of breast. Breasts felt softer when LC finished consult. Mom has my # to call for assist w/next feeding.     Maternal Data    Feeding Feeding Type: Breast Milk Nipple Type: Slow - flow  LATCH Score Latch:  (instructed MOB to call for next latch)                 Interventions Interventions: Ice;DEBP  Lactation Tools Discussed/Used     Consult Status Consult Status: Follow-up Date: 10/01/16 Follow-up type: In-patient    Vivianne Master [facility] 09/30/2016, 12:47 PM

## 2016-09-30 NOTE — Lactation Note (Signed)
This note was copied from a baby's chart. Lactation Consultation Note Cabbage, ice, and pumping helpful. Now used DEBP again after resting. Pumped 9ml. Breast still painful. Encouraged FOB to assist in massaging while pumping. Outer side hard. Mom laid down w/cabbage, ice. Strongly encouraged to put baby to breast for next feeding using #20 NS. Encouraged to call for latch assist. Mom nipples swollen d/t pumping.  Baby jaundice in appearance. Will have serum at 0500. FOB giving transitional milk in bottles.   Patient Name: Chelsea [name] Date: 09/30/2016 Reason for consult: Follow-up assessment;Engorgement   Maternal Data    Feeding    LATCH Score       Type of Nipple: Flat  Comfort (Breast/Nipple): Engorged, cracked, bleeding, large blisters, severe discomfort  Hold (Positioning): Full assist, staff holds infant at breast     Interventions Interventions: Ice;DEBP;Breast compression;Breast massage  Lactation Tools Discussed/Used Tools: Nipple Shields Nipple shield size: 20 Breast pump type: Double-Electric Breast Pump Date initiated:: 09/30/16   Consult Status Consult Status: Follow-up Date: 09/30/16 Follow-up type: In-patient    Macarthur Lorusso, Elta Guadeloupe 09/30/2016, 4:16 AM

## 2016-10-01 ENCOUNTER — Ambulatory Visit: Payer: Self-pay

## 2016-10-01 NOTE — Lactation Note (Signed)
This note was copied from a baby's chart. Lactation Consultation Note  Patient Name: Chelsea [name] Date: 10/01/2016   Met with Mom on day of discharge, baby 81 days old.  Baby gained >2oz. Baby at 5% weight loss. Mom's engorgement has resolved for the most part.  Mom is pumping 4oz at most pumpings.  Mom has a DEBP at home.  Baby fed at breast with RN assistance this am.  Mom denied any discomfort, and stated her breasts were softened after feeding.   Discussed secondary engorgement if baby doesn't latch well and/or she isn't pumping regularly (>8 times per 24 hrs).  Importance of keeping breasts emptied discussed. Recommended feeding baby STS on cue (>8 times per 24 hrs).   Recommended OP lactation appointment.  Offered to make an appointment, but Mom stated that she would be getting assistance at [facility] office ([facility]) Reminded Mom of OP lactation support available.   Encouraged her to call prn.   Adreana, [place] 10/01/2016, 11:11 AM

## 2016-10-05 ENCOUNTER — Encounter (HOSPITAL_COMMUNITY): Payer: Self-pay | Admitting: *Deleted

## 2017-02-16 DIAGNOSIS — Z3202 Encounter for pregnancy test, result negative: Secondary | ICD-10-CM | POA: Diagnosis not present

## 2017-02-25 DIAGNOSIS — M6281 Muscle weakness (generalized): Secondary | ICD-10-CM | POA: Diagnosis not present

## 2017-04-30 DIAGNOSIS — J0100 Acute maxillary sinusitis, unspecified: Secondary | ICD-10-CM | POA: Diagnosis not present

## 2017-04-30 DIAGNOSIS — M5442 Lumbago with sciatica, left side: Secondary | ICD-10-CM | POA: Diagnosis not present

## 2017-04-30 DIAGNOSIS — M5441 Lumbago with sciatica, right side: Secondary | ICD-10-CM | POA: Diagnosis not present

## 2017-05-19 DIAGNOSIS — Z3202 Encounter for pregnancy test, result negative: Secondary | ICD-10-CM | POA: Diagnosis not present

## 2017-07-17 ENCOUNTER — Emergency Department (HOSPITAL_COMMUNITY): Payer: 59

## 2017-07-17 ENCOUNTER — Other Ambulatory Visit: Payer: Self-pay

## 2017-07-17 ENCOUNTER — Emergency Department (HOSPITAL_COMMUNITY)
Admission: EM | Admit: 2017-07-17 | Discharge: 2017-07-17 | Disposition: A | Discharge status: Home / Self Care | Payer: 59 | Attending: Physician Assistant | Admitting: Physician Assistant

## 2017-07-17 ENCOUNTER — Encounter (HOSPITAL_COMMUNITY): Payer: Self-pay

## 2017-07-17 DIAGNOSIS — R0789 Other chest pain: Secondary | ICD-10-CM | POA: Insufficient documentation

## 2017-07-17 DIAGNOSIS — I499 Cardiac arrhythmia, unspecified: Secondary | ICD-10-CM | POA: Diagnosis not present

## 2017-07-17 DIAGNOSIS — R072 Precordial pain: Secondary | ICD-10-CM | POA: Diagnosis not present

## 2017-07-17 DIAGNOSIS — R0602 Shortness of breath: Secondary | ICD-10-CM | POA: Diagnosis not present

## 2017-07-17 DIAGNOSIS — Z79899 Other long term (current) drug therapy: Secondary | ICD-10-CM | POA: Insufficient documentation

## 2017-07-17 DIAGNOSIS — J45909 Unspecified asthma, uncomplicated: Secondary | ICD-10-CM | POA: Insufficient documentation

## 2017-07-17 DIAGNOSIS — R079 Chest pain, unspecified: Secondary | ICD-10-CM | POA: Diagnosis not present

## 2017-07-17 DIAGNOSIS — R230 Cyanosis: Secondary | ICD-10-CM | POA: Diagnosis not present

## 2017-07-17 DIAGNOSIS — R0600 Dyspnea, unspecified: Secondary | ICD-10-CM | POA: Diagnosis not present

## 2017-07-17 LAB — BASIC METABOLIC PANEL
ANION GAP: 11 (ref 5–15)
BUN: 11 mg/dL (ref 6–20)
CALCIUM: 9.8 mg/dL (ref 8.9–10.3)
CO2: 23 mmol/L (ref 22–32)
Chloride: 104 mmol/L (ref 101–111)
Creatinine, Ser: 0.78 mg/dL (ref 0.44–1.00)
GFR calc Af Amer: >60 mL/min (ref >60)
GFR calc non Af Amer: >60 mL/min (ref >60)
GLUCOSE: 72 mg/dL (ref 65–99)
POTASSIUM: 3.8 mmol/L (ref 3.5–5.1)
Sodium: 138 mmol/L (ref 135–145)

## 2017-07-17 LAB — I-STAT TROPONIN, ED
Troponin i, poc: 0 ng/mL (ref 0.00–0.08)
Troponin i, poc: 0 ng/mL (ref 0.00–0.08)
Troponin i, poc: 0.05 ng/mL (ref 0.00–0.08)

## 2017-07-17 LAB — I-STAT BETA HCG BLOOD, ED (MC, WL, AP ONLY): I-stat hCG, quantitative: <5 m[IU]/mL (ref <5)

## 2017-07-17 LAB — CBC
HEMATOCRIT: 37.4 % (ref 36.0–46.0)
HEMOGLOBIN: 12.1 g/dL (ref 12.0–15.0)
MCH: 29.4 pg (ref 26.0–34.0)
MCHC: 32.4 g/dL (ref 30.0–36.0)
MCV: 90.8 fL (ref 78.0–100.0)
Platelets: 207 10*3/uL (ref 150–400)
RBC: 4.12 MIL/uL (ref 3.87–5.11)
RDW: 12.7 % (ref 11.5–15.5)
WBC: 8.3 10*3/uL (ref 4.0–10.5)

## 2017-07-17 LAB — D-DIMER, QUANTITATIVE (NOT AT ARMC): D-Dimer, Quant: <0.27 ug/mL-FEU (ref 0.00–0.50)

## 2017-07-17 MED ORDER — FENTANYL CITRATE (PF) 100 MCG/2ML IJ SOLN
50.0000 ug | Freq: Once | INTRAMUSCULAR | Status: AC
  Administered 2017-07-17: 50 ug via INTRAVENOUS
  Filled 2017-07-17: qty 2

## 2017-07-17 MED ORDER — ONDANSETRON HCL 4 MG/2ML IJ SOLN
4.0000 mg | Freq: Once | INTRAMUSCULAR | Status: AC
Start: 2017-07-17 — End: 2017-07-17
  Administered 2017-07-17: 4 mg via INTRAVENOUS
  Filled 2017-07-17: qty 2

## 2017-07-17 NOTE — ED Notes (Signed)
EDP at bedside  

## 2017-07-17 NOTE — Discharge Instructions (Signed)
Alternate 600 mg of ibuprofen and [phone] mg of Tylenol every 3 hours as needed for pain. Do not exceed 4000 mg of Tylenol daily.  Take ibuprofen with food to avoid upset stomach issues.  Apply heating pads or ice pack 20 minutes on 20 minutes off, which ever feels best.  Follow-up with your primary care physician for reevaluation of your symptoms within 1 week.  Return to the emergency department immediately if any concerning signs or symptoms develop such as persistent fevers, chest pain, shortness of breath, abdominal pain, passing out, or coughing up blood.

## 2017-07-17 NOTE — ED Notes (Signed)
Patient transported to X-ray 

## 2017-07-17 NOTE — ED Provider Notes (Signed)
[place] EMERGENCY DEPARTMENT Provider Note   CSN: [id number] Arrival date & time: 07/17/17  1557     History   Chief Complaint Chief Complaint  Patient presents with  . Chest Pain    HPI Chelsea [name] is a 32 y.o. female with history of asthma, back pain, presents for evaluation of acute onset, somewhat improving chest pain.  Patient states that at around 2 PM she was walking to find a bed for her baby when she experienced sudden onset sharp stabbing pain substernally radiating to the left side of the chest and left upper extremity.  She also noted associated shortness of breath, diaphoresis, and nausea.  No vomiting or syncope.  Pain worsens with deep inspiration.  She tried 600 mg of ibuprofen without relief of her symptoms.  She went to an urgent care and EMS was called.  They performed an EKG at that time and told her that her EKG was abnormal.  She was given 4 baby aspirin and 4 sublingual nitroglycerin with some relief of her symptoms.  She denies abdominal pain, vomiting, fevers.  She has had a chronic nonproductive cough for 1 year nonproductive with no change in her chronic cough.  Denies leg swelling, no recent travel or surgeries, no hemoptysis, no prior history of DVT or PE.  She is on the Depo-Provera shot.  States she had a prior history of cancerous lesions of the cervix but only required the HPV vaccine and no other treatment.  She is a non-smoker, denies excessive alcohol intake or recreational drug use.  The history is provided by the patient.    Past Medical History:  Diagnosis Date  . Asthma   . Back pain   . Cancer [facility])     Patient Active Problem List   Diagnosis Date Noted  . Normal labor 09/26/2016    Past Surgical History:  Procedure Laterality Date  . TONSILLECTOMY       OB History    Gravida  2   Para  2   Term  1   Preterm  1   AB      Living  2     SAB      TAB      Ectopic      Multiple  0   Live Births  2             Home Medications    Prior to Admission medications   Medication Sig Start Date End Date Taking? Authorizing Provider  acetaminophen (TYLENOL) 500 MG tablet Take 500-1,000 mg by mouth every 6 (six) hours as needed (for pain or headaches).   Yes [provider]  albuterol (PROVENTIL HFA;VENTOLIN HFA) 108 (90 Base) MCG/ACT inhaler Inhale 1-2 puffs into the lungs every 6 (six) hours as needed for wheezing or shortness of breath.   Yes [provider]  cyclobenzaprine (FLEXERIL) 5 MG tablet Take 10 mg by mouth at bedtime.   Yes [provider]  ibuprofen (ADVIL,MOTRIN) 600 MG tablet Take 1 tablet (600 mg total) by mouth every 6 (six) hours as needed. Patient taking differently: Take 600 mg by mouth every 6 (six) hours as needed (for pain or headaches).  09/29/16  Yes Vanessa Kick, MD  medroxyPROGESTERone (DEPO-PROVERA) 150 MG/ML injection Inject 150 mg into the muscle every 3 (three) months.   Yes [provider]  docusate sodium (COLACE) 100 MG capsule Take 1 capsule (100 mg total) by mouth 2 (two) times daily. Patient  not taking: Reported on 07/17/2017 09/29/16   Vanessa Kick, MD  HYDROcodone-acetaminophen (NORCO/VICODIN) 5-325 MG tablet 1-2 tablets every 4-6 hours as needed for pain Patient not taking: Reported on 07/17/2017 09/29/16   Vanessa Kick, MD    Family History Family History  Problem Relation Age of Onset  . Hypertension Mother   . Hypertension Father     Social History Social History   Tobacco Use  . Smoking status: Never Smoker  . Smokeless tobacco: Never Used  Substance Use Topics  . Alcohol use: No  . Drug use: No     Allergies   Hydromet [hydrocodone-homatropine]; Morphine and related; and Penicillins   Review of Systems Review of Systems  Constitutional: Positive for chills and diaphoresis. Negative for fever.  Respiratory: Positive for cough (chronic, unchanged) and shortness of breath.   Cardiovascular: Positive  for chest pain. Negative for leg swelling.  Gastrointestinal: Positive for nausea. Negative for abdominal pain, diarrhea and vomiting.  All other systems reviewed and are negative.    Physical Exam Updated Vital Signs BP 115/80   Pulse 66   Temp 98.1 F (36.7 C) (Oral)   Resp 18   SpO2 99%   Physical Exam  Constitutional: She appears well-developed and well-nourished. No distress.  HENT:  Head: Normocephalic and atraumatic.  Eyes: Conjunctivae are normal. Right eye exhibits no discharge. Left eye exhibits no discharge.  Neck: No JVD present. No tracheal deviation present.  Cardiovascular: Normal rate.  Pulses:      Carotid pulses are 2+ on the right side, and 2+ on the left side.      Radial pulses are 2+ on the right side, and 2+ on the left side.       Dorsalis pedis pulses are 2+ on the right side, and 2+ on the left side.       Posterior tibial pulses are 2+ on the right side, and 2+ on the left side.  Intermittently tachycardic.  2+ radial and DP/PT pulses bilaterally.  No lower extremity edema.  Homans sign absent bilaterally.  No palpable cords.  Hands and feet are somewhat cool to touch but equally so bilaterally  Pulmonary/Chest: Effort normal and breath sounds normal.  Pain with deep inspiration.  Lungs clear to auscultation bilaterally.  Speaking in full sentences without difficulty.  No tenderness to palpation of the chest wall.  Abdominal: Soft. Bowel sounds are normal. She exhibits no distension. There is no tenderness.  Musculoskeletal: She exhibits no edema.       Right lower leg: Normal. She exhibits no tenderness and no edema.       Left lower leg: Normal. She exhibits no tenderness and no edema.  Neurological: She is alert.  Skin: Skin is warm and dry. No erythema.  Psychiatric: She has a normal mood and affect. Her behavior is normal.  Nursing note and vitals reviewed.    ED Treatments / Results  Labs (all labs ordered are listed, but only abnormal  results are displayed) Labs Reviewed  BASIC METABOLIC PANEL  CBC  D-DIMER, QUANTITATIVE (NOT AT [facility])  I-STAT TROPONIN, ED  I-STAT BETA HCG BLOOD, ED ([facility], [facility], [facility] ONLY)  I-STAT TROPONIN, ED  I-STAT TROPONIN, ED    EKG EKG Interpretation  Date/Time:  Saturday July 17 2017 16:04:00 EDT Ventricular Rate:  97 PR Interval:    QRS Duration: 83 QT Interval:  350 QTC Calculation: 445 R Axis:   60 Text Interpretation:  Sinus rhythm Borderline T wave abnormalities No significant  change since last tracing Confirmed by Zenovia Jarred [phone]) on 07/17/2017 4:22:19 PM   Radiology Dg Chest 2 View  Result Date: 07/17/2017 CLINICAL DATA:  Chest pain. EXAM: CHEST - 2 VIEW COMPARISON:  None. FINDINGS: The heart size and mediastinal contours are within normal limits. Both lungs are clear. No pneumothorax or pleural effusion is noted. The visualized skeletal structures are unremarkable. IMPRESSION: No active cardiopulmonary disease. Electronically Signed   By: Marijo Conception, M.D.   On: 07/17/2017 17:14    Procedures Procedures (including critical care time)  Medications Ordered in ED Medications  ondansetron (ZOFRAN) injection 4 mg (4 mg Intravenous Given 07/17/17 1753)  fentaNYL (SUBLIMAZE) injection 50 mcg (50 mcg Intravenous Given 07/17/17 1753)     Initial Impression / Assessment and Plan / ED Course  I have reviewed the triage vital signs and the nursing notes.  Pertinent labs & imaging results that were available during my care of the patient were reviewed by me and considered in my medical decision making (see chart for details).     Patient with sudden onset chest pain and shortness of breath at around 2 PM.  She is afebrile, intermittently mildly tachypneic and tachycardic while in the ED.  She is not hypoxic no significantly increased work of breathing on examination.  Chest x-ray shows no acute cardiopulmonary abnormalities, no evidence of edema or consolidation.  EKG shows no  significant changes from last tracing and no changes concerning for ischemia.  Serial troponins are negative.  I doubt ACS, MI, myocarditis, or pericarditis.  Remainder of lab work reviewed by me shows no leukocytosis, no anemia, no electrolyte abnormalities.  Her d-dimer is negative and I have low suspicion of PE at this time.  She was given fentanyl and Zofran with resolution of her symptoms entirely.  On reevaluation she states she is feeling much better.  She has no abdominal pain or tenderness to suggest cholecystitis, pancreatitis, or other abdominal source of her current symptoms.  No further emergent work-up required at this time.  She stable for discharge home with follow-up with her PCP.  Recommend NSAIDs, heat therapy.  Discussed strict ED return precautions.  Patient and patient's husband verbalized understanding of and agreement with plan and patient is stable for discharge home at this time.Discussed with Dr. Thomasene Lot who agrees with assessment and plan at this time.   Final Clinical Impressions(s) / ED Diagnoses   Final diagnoses:  Atypical chest pain  SOB (shortness of breath)    ED Discharge Orders    None       Renita Papa, PA-C 07/17/17 2140    Macarthur Critchley, MD 07/20/17 0740

## 2017-07-17 NOTE — ED Triage Notes (Signed)
Pt brought in by [facility] from [place] office for centralized chest pressure that radiates to pt left arm that started around 1400. Pt being treated for postpartum depression, states had similar episode and was diagnosed with anxiety. However today EKG changes noted. Pt given 324mg  aspirin and x4 nitro PTA. Pt states unsure if nitro relieved the pain. Pt A+Ox4 and in NAD on arrival.

## 2017-07-20 DIAGNOSIS — M940 Chondrocostal junction syndrome [Tietze]: Secondary | ICD-10-CM | POA: Diagnosis not present

## 2017-07-20 DIAGNOSIS — R0789 Other chest pain: Secondary | ICD-10-CM | POA: Diagnosis not present

## 2017-08-11 ENCOUNTER — Encounter: Payer: Self-pay | Admitting: *Deleted

## 2017-08-13 DIAGNOSIS — R0789 Other chest pain: Secondary | ICD-10-CM | POA: Insufficient documentation

## 2017-08-19 ENCOUNTER — Ambulatory Visit: Payer: 59 | Admitting: Cardiology

## 2017-08-19 ENCOUNTER — Encounter: Payer: Self-pay | Admitting: Cardiology

## 2017-08-19 ENCOUNTER — Encounter (INDEPENDENT_AMBULATORY_CARE_PROVIDER_SITE_OTHER): Payer: Self-pay

## 2017-08-19 ENCOUNTER — Encounter

## 2017-08-19 VITALS — BP 134/78 | HR 97 | Ht 63.0 in | Wt 166.8 lb

## 2017-08-19 DIAGNOSIS — F419 Anxiety disorder, unspecified: Secondary | ICD-10-CM

## 2017-08-19 DIAGNOSIS — R0789 Other chest pain: Secondary | ICD-10-CM | POA: Diagnosis not present

## 2017-08-19 DIAGNOSIS — R0600 Dyspnea, unspecified: Secondary | ICD-10-CM | POA: Diagnosis not present

## 2017-08-19 NOTE — Assessment & Plan Note (Signed)
Delivered her second child Sept 2018

## 2017-08-19 NOTE — Assessment & Plan Note (Signed)
Localized, sharp Lt chest pain

## 2017-08-19 NOTE — Assessment & Plan Note (Signed)
Generalized feeling of dyspnea, no orthopnea

## 2017-08-19 NOTE — Progress Notes (Signed)
08/19/2017 Chelsea [name]   [birth year]  [id number]  Primary Physician Patient, No Pcp Per Primary Cardiologist: Dr Harrell Gave (new)  HPI:  32 y/o female seen in the office today after an ED visit 07/17/17. She presented then with complaints of sharp Lt sided chest pain while shopping. It apparently had a pleuritic component.  She tried 600 mg of ibuprofen without relief of her symptoms.  She went to an urgent care and EMS was called.  They performed an EKG at that time and told her that her EKG was abnormal.  She was given 4 baby aspirin and 4 sublingual nitroglycerin with some relief of her symptoms. In the ED her EKG showed no acute changes, Troponin was negative x 3, D-dimer was negative, and CXR showed NAD.  She admits she has some stress in her life, she is a stay at home mom, her husband is about to graduate the [employer]. She continues to have vague feeling of dyspnea, nervousness, and dyspnea.    Current Outpatient Medications  Medication Sig Dispense Refill  . acetaminophen (TYLENOL) 500 MG tablet Take 500-1,000 mg by mouth every 6 (six) hours as needed (for pain or headaches).    Marland Kitchen albuterol (PROVENTIL HFA;VENTOLIN HFA) 108 (90 Base) MCG/ACT inhaler Inhale 1-2 puffs into the lungs every 6 (six) hours as needed for wheezing or shortness of breath.    . cyclobenzaprine (FLEXERIL) 5 MG tablet Take 10 mg by mouth 3 (three) times daily as needed.     . venlafaxine (EFFEXOR) 37.5 MG tablet Take 37.5 mg by mouth daily.  1   No current facility-administered medications for this visit.     Allergies  Allergen Reactions  . Hydromet [Hydrocodone-Homatropine] Anaphylaxis, Itching and Swelling    Patient states that her throat closes  . Morphine And Related Itching and Other (See Comments)    Makes entire burn and itch SEVERELY; "clawed" her skin  . Penicillins Palpitations    Has patient had a PCN reaction causing immediate rash, facial/tongue/throat swelling, SOB or lightheadedness  with hypotension: Yes Has patient had a PCN reaction causing severe rash involving mucus membranes or skin necrosis: No Has patient had a PCN reaction that required hospitalization: No Has patient had a PCN reaction occurring within the last 10 years: Yes If all of the above answers are "NO", then may proceed with Cephalosporin use.     Past Medical History:  Diagnosis Date  . Asthma   . Atypical chest pain   . Back pain   . Cancer ([place])   . Hyperlipidemia     Social History   Socioeconomic History  . Marital status: Married    Spouse name: Not on file  . Number of children: Not on file  . Years of education: Not on file  . Highest education level: Not on file  Occupational History  . Not on file  Social Needs  . Financial resource strain: Not on file  . Food insecurity:    Worry: Not on file    Inability: Not on file  . Transportation needs:    Medical: Not on file    Non-medical: Not on file  Tobacco Use  . Smoking status: Never Smoker  . Smokeless tobacco: Never Used  Substance and Sexual Activity  . Alcohol use: No  . Drug use: No  . Sexual activity: Not on file  Lifestyle  . Physical activity:    Days per week: Not on file    Minutes per session:  Not on file  . Stress: Not on file  Relationships  . Social connections:    Talks on phone: Not on file    Gets together: Not on file    Attends religious service: Not on file    Active member of club or organization: Not on file    Attends meetings of clubs or organizations: Not on file    Relationship status: Not on file  . Intimate partner violence:    Fear of current or ex partner: Not on file    Emotionally abused: Not on file    Physically abused: Not on file    Forced sexual activity: Not on file  Other Topics Concern  . Not on file  Social History Narrative  . Not on file     Family History  Problem Relation Age of Onset  . Hypertension Mother   . Hypertension Father      Review of  Systems: General: negative for chills, fever, night sweats or weight changes.  Cardiovascular: negative for chest pain, dyspnea on exertion, edema, orthopnea, palpitations, paroxysmal nocturnal dyspnea or shortness of breath Dermatological: negative for rash Respiratory: negative for cough or wheezing Urologic: negative for hematuria Abdominal: negative for nausea, vomiting, diarrhea, bright red blood per rectum, melena, or hematemesis Neurologic: negative for visual changes, syncope, or dizziness All other systems reviewed and are otherwise negative except as noted above.    Blood pressure 134/78, pulse 97, height 5\' 3"  (1.6 m), weight 166 lb 12.8 oz (75.7 kg), unknown if currently breastfeeding.  General appearance: alert, cooperative, no distress and moderately obese Neck: no carotid bruit and no JVD Lungs: clear to auscultation bilaterally Heart: regular rate and rhythm Extremities: extremities normal, atraumatic, no cyanosis or edema Skin: Skin color, texture, turgor normal. No rashes or lesions Neurologic: Grossly normal  Gait: she is kyphotic and has a slightly shuffling gait   EKG NSR, HR 96, NSST changes  ASSESSMENT AND PLAN:   Atypical chest pain Localized, sharp Lt chest pain  Dyspnea Generalized feeling of dyspnea, no orthopnea  Normal labor Delivered her second child Sept 2018  Anxiety Feeling of nervousness, "jittery"   PLAN  Check echo, check TSH, f/u with Dr Harrell Gave after the above test.   Kerin Ransom PA-C 08/19/2017 3:44 PM

## 2017-08-19 NOTE — Patient Instructions (Addendum)
Medication Instructions: Your physician recommends that you continue on your current medications as directed. Please refer to the Current Medication list given to you today.   Labwork: TODAY: TSH  Procedures/Testing: Your physician has requested that you have an echocardiogram. Echocardiography is a painless test that uses sound waves to create images of your heart. It provides your doctor with information about the size and shape of your heart and how well your heart's chambers and valves are working. This procedure takes approximately one hour. There are no restrictions for this procedure.    Follow-Up: Your physician recommends that you schedule a follow-up appointment in: 2 weeks with Dr. Harrell Gave    Any Additional Special Instructions Will Be Listed Below (If Applicable).     If you need a refill on your cardiac medications before your next appointment, please call your pharmacy.

## 2017-08-19 NOTE — Assessment & Plan Note (Signed)
Feeling of nervousness, "jittery"

## 2017-08-20 ENCOUNTER — Telehealth: Payer: Self-pay | Admitting: Cardiology

## 2017-08-20 LAB — TSH: TSH: 1.22 u[IU]/mL (ref 0.450–4.500)

## 2017-08-20 NOTE — Telephone Encounter (Signed)
New Message   Pt returning call for nurse. Please call

## 2017-08-20 NOTE — Telephone Encounter (Signed)
Called patient, gave lab results. No questions or concerns.

## 2017-08-27 ENCOUNTER — Ambulatory Visit (HOSPITAL_COMMUNITY): Payer: 59 | Attending: Cardiovascular Disease

## 2017-08-27 ENCOUNTER — Other Ambulatory Visit: Payer: Self-pay

## 2017-08-27 DIAGNOSIS — I313 Pericardial effusion (noninflammatory): Secondary | ICD-10-CM | POA: Diagnosis not present

## 2017-08-27 DIAGNOSIS — E785 Hyperlipidemia, unspecified: Secondary | ICD-10-CM | POA: Insufficient documentation

## 2017-08-27 DIAGNOSIS — R0600 Dyspnea, unspecified: Secondary | ICD-10-CM | POA: Diagnosis not present

## 2017-08-27 DIAGNOSIS — M549 Dorsalgia, unspecified: Secondary | ICD-10-CM | POA: Insufficient documentation

## 2017-08-27 DIAGNOSIS — R0789 Other chest pain: Secondary | ICD-10-CM | POA: Diagnosis not present

## 2017-08-27 DIAGNOSIS — J45909 Unspecified asthma, uncomplicated: Secondary | ICD-10-CM | POA: Insufficient documentation

## 2017-09-10 DIAGNOSIS — M5442 Lumbago with sciatica, left side: Secondary | ICD-10-CM | POA: Diagnosis not present

## 2017-09-10 DIAGNOSIS — Z136 Encounter for screening for cardiovascular disorders: Secondary | ICD-10-CM | POA: Diagnosis not present

## 2017-09-22 ENCOUNTER — Ambulatory Visit: Payer: 59 | Admitting: Cardiology

## 2017-10-15 DIAGNOSIS — M545 Low back pain: Secondary | ICD-10-CM | POA: Diagnosis not present

## 2017-10-29 DIAGNOSIS — M545 Low back pain: Secondary | ICD-10-CM | POA: Diagnosis not present

## 2017-11-12 DIAGNOSIS — M545 Low back pain: Secondary | ICD-10-CM | POA: Diagnosis not present

## 2017-12-31 DIAGNOSIS — J019 Acute sinusitis, unspecified: Secondary | ICD-10-CM | POA: Diagnosis not present

## 2019-02-06 ENCOUNTER — Ambulatory Visit
Admission: EM | Admit: 2019-02-06 | Discharge: 2019-02-06 | Disposition: A | Discharge status: Home / Self Care | Payer: 59 | Attending: Emergency Medicine | Admitting: Emergency Medicine

## 2019-02-06 ENCOUNTER — Other Ambulatory Visit: Payer: Self-pay

## 2019-02-06 DIAGNOSIS — Z20822 Contact with and (suspected) exposure to covid-19: Secondary | ICD-10-CM | POA: Diagnosis not present

## 2019-02-06 NOTE — Discharge Instructions (Addendum)

## 2019-02-06 NOTE — ED Triage Notes (Signed)
Pt has lost of taste and smell for past 3 days

## 2019-02-06 NOTE — ED Provider Notes (Signed)
[facility]    CSN: [id number] Arrival date & time: 02/06/19  1552      History   Chief Complaint No chief complaint on file.   HPI Nuriya Coldwell is a 34 y.o. female.   Chauntel Blayney 34 years old female presented to the urgent care with a complaint of loss of taste and smell for the past 3 days.  Denies sick exposure to COVID, flu or strep.  Denies recent travel.  Denies aggravating or alleviating symptoms.  Denies previous COVID infection.   Denies fever, chills, fatigue, nasal congestion, rhinorrhea, sore throat, cough, SOB, wheezing, chest pain, nausea, vomiting, changes in bowel or bladder habits.    The history is provided by the patient. No language interpreter was used.    Past Medical History:  Diagnosis Date  . Asthma   . Atypical chest pain   . Back pain   . Cancer ([place])   . Hyperlipidemia     Patient Active Problem List   Diagnosis Date Noted  . Dyspnea 08/19/2017  . Anxiety 08/19/2017  . Atypical chest pain   . Normal labor 09/26/2016    Past Surgical History:  Procedure Laterality Date  . TONSILLECTOMY      OB History    Gravida  2   Para  2   Term  1   Preterm  1   AB      Living  2     SAB      TAB      Ectopic      Multiple  0   Live Births  2            Home Medications    Prior to Admission medications   Medication Sig Start Date End Date Taking? Authorizing Provider  acetaminophen (TYLENOL) 500 MG tablet Take 500-1,000 mg by mouth every 6 (six) hours as needed (for pain or headaches).    [provider]  albuterol (PROVENTIL HFA;VENTOLIN HFA) 108 (90 Base) MCG/ACT inhaler Inhale 1-2 puffs into the lungs every 6 (six) hours as needed for wheezing or shortness of breath.    [provider]  cyclobenzaprine (FLEXERIL) 5 MG tablet Take 10 mg by mouth 3 (three) times daily as needed.     [provider]  venlafaxine (EFFEXOR) 37.5 MG tablet Take 37.5 mg by mouth daily. 07/20/17   [provider]    Family History Family History  Problem Relation Age of Onset  . Hypertension Mother   . Hypertension Father     Social History Social History   Tobacco Use  . Smoking status: Never Smoker  . Smokeless tobacco: Never Used  Substance Use Topics  . Alcohol use: No  . Drug use: No     Allergies   Hydromet [hydrocodone-homatropine], Morphine and related, and Penicillins   Review of Systems Review of Systems  Constitutional: Negative.   HENT: Negative.   Respiratory: Negative.   Cardiovascular: Negative.   Gastrointestinal: Negative.   Neurological: Negative.        Loss of taste and smell     Physical Exam Triage Vital Signs ED Triage Vitals  Enc Vitals Group     BP 02/06/19 1624 131/86     Pulse Rate 02/06/19 1624 90     Resp 02/06/19 1624 17     Temp 02/06/19 1624 98.4 F (36.9 C)     Temp Source 02/06/19 1624 Oral     SpO2 02/06/19 1624 99 %  Weight --      Height --      Head Circumference --      Peak Flow --      Pain Score 02/06/19 1633 0     Pain Loc --      Pain Edu? --      Excl. in [place]? --    No data found.  Updated Vital Signs BP 131/86 (BP Location: Right Arm)   Pulse 90   Temp 98.4 F (36.9 C) (Oral)   Resp 17   SpO2 99%   Visual Acuity Right Eye Distance:   Left Eye Distance:   Bilateral Distance:    Right Eye Near:   Left Eye Near:    Bilateral Near:     Physical Exam Vitals and nursing note reviewed.  Constitutional:      General: She is not in acute distress.    Appearance: Normal appearance. She is normal weight. She is not ill-appearing or toxic-appearing.  HENT:     Head: Normocephalic.     Right Ear: Tympanic membrane, ear canal and external ear normal. There is no impacted cerumen.     Left Ear: Tympanic membrane, ear canal and external ear normal. There is no impacted cerumen.     Nose: Nose normal. No congestion.     Mouth/Throat:     Mouth: Mucous membranes are moist.     Pharynx: No  oropharyngeal exudate or posterior oropharyngeal erythema.  Cardiovascular:     Rate and Rhythm: Normal rate and regular rhythm.     Pulses: Normal pulses.     Heart sounds: Normal heart sounds. No murmur.  Pulmonary:     Effort: Pulmonary effort is normal. No respiratory distress.     Breath sounds: Normal breath sounds. No wheezing or rhonchi.  Chest:     Chest wall: No tenderness.  Abdominal:     General: Abdomen is flat. Bowel sounds are normal. There is no distension.     Palpations: There is no mass.  Skin:    Capillary Refill: Capillary refill takes less than 2 seconds.  Neurological:     General: No focal deficit present.     Mental Status: She is alert and oriented to person, place, and time.     Cranial Nerves: No cranial nerve deficit.     Sensory: No sensory deficit.      UC Treatments / Results  Labs (all labs ordered are listed, but only abnormal results are displayed) Labs Reviewed  NOVEL CORONAVIRUS, NAA    EKG   Radiology No results found.  Procedures Procedures (including critical care time)  Medications Ordered in UC Medications - No data to display  Initial Impression / Assessment and Plan / UC Course  I have reviewed the triage vital signs and the nursing notes.  Pertinent labs & imaging results that were available during my care of the patient were reviewed by me and considered in my medical decision making (see chart for details).   COVID-19 test was ordered.  Patient is stable for discharge.  Advised patient to quarantine until COVID-19 test result become available.  To go to ED for worsening symptoms.  Patient verbalized understanding plan of care.  Final Clinical Impressions(s) / UC Diagnoses   Final diagnoses:  Suspected COVID-19 virus infection     Discharge Instructions     COVID testing ordered.  It will take between 2-7 days for test results.  Someone will contact you regarding abnormal results.  In the meantime: You  should remain isolated in your home for 10 days from symptom onset AND greater than 72 hours after symptoms resolution (absence of fever without the use of fever-reducing medication and improvement in respiratory symptoms), whichever is longer Get plenty of rest and push fluids Use medications daily for symptom relief Use OTC medications like ibuprofen or tylenol as needed fever or pain Call or go to the ED if you have any new or worsening symptoms such as fever, worsening cough, shortness of breath, chest tightness, chest pain, turning blue, changes in mental status, etc...     ED Prescriptions    None     PDMP not reviewed this encounter.   Emerson Monte, [place] 02/06/19 1704

## 2019-02-08 LAB — NOVEL CORONAVIRUS, NAA: SARS-CoV-2, NAA: NOT DETECTED

## 2019-04-19 ENCOUNTER — Ambulatory Visit
Admission: RE | Admit: 2019-04-19 | Discharge: 2019-04-19 | Disposition: A | Discharge status: Home / Self Care | Payer: 59 | Source: Ambulatory Visit | Attending: Family Medicine | Admitting: Family Medicine

## 2019-04-19 ENCOUNTER — Other Ambulatory Visit: Payer: Self-pay | Admitting: Family Medicine

## 2019-04-19 DIAGNOSIS — R05 Cough: Secondary | ICD-10-CM

## 2019-04-19 DIAGNOSIS — R059 Cough, unspecified: Secondary | ICD-10-CM

## 2019-08-25 ENCOUNTER — Encounter (HOSPITAL_BASED_OUTPATIENT_CLINIC_OR_DEPARTMENT_OTHER): Payer: Self-pay | Admitting: Obstetrics and Gynecology

## 2019-08-25 NOTE — Progress Notes (Signed)
Called pt via phone for pre-op interview and make covid test appointment for surgery on 09-04-2019.  Pt stated she has changed her mind and wants to cancel surgery.  Pt verbalized understanding that she needs to call Dr Rogue Bussing office and speak with OR scheduler to cancel.

## 2019-09-04 ENCOUNTER — Encounter (HOSPITAL_BASED_OUTPATIENT_CLINIC_OR_DEPARTMENT_OTHER): Payer: Self-pay

## 2019-09-04 ENCOUNTER — Ambulatory Visit (HOSPITAL_BASED_OUTPATIENT_CLINIC_OR_DEPARTMENT_OTHER): Admit: 2019-09-04 | Payer: 59 | Admitting: Obstetrics and Gynecology

## 2019-09-04 SURGERY — LIGATION, FALLOPIAN TUBE, LAPAROSCOPIC
Anesthesia: Choice | Laterality: Bilateral

## 2020-07-27 DIAGNOSIS — J4520 Mild intermittent asthma, uncomplicated: Secondary | ICD-10-CM | POA: Insufficient documentation

## 2020-12-17 ENCOUNTER — Other Ambulatory Visit: Payer: Self-pay | Admitting: Sports Medicine

## 2020-12-17 DIAGNOSIS — R221 Localized swelling, mass and lump, neck: Secondary | ICD-10-CM

## 2020-12-18 ENCOUNTER — Other Ambulatory Visit: Payer: Self-pay | Admitting: Sports Medicine

## 2020-12-18 DIAGNOSIS — R221 Localized swelling, mass and lump, neck: Secondary | ICD-10-CM

## 2020-12-27 ENCOUNTER — Ambulatory Visit
Admission: RE | Admit: 2020-12-27 | Discharge: 2020-12-27 | Disposition: A | Discharge status: Home / Self Care | Payer: 59 | Source: Ambulatory Visit | Attending: Sports Medicine | Admitting: Sports Medicine

## 2020-12-27 DIAGNOSIS — R221 Localized swelling, mass and lump, neck: Secondary | ICD-10-CM

## 2020-12-27 MED ORDER — IOPAMIDOL (ISOVUE-300) INJECTION 61%
75.0000 mL | Freq: Once | INTRAVENOUS | Status: AC | PRN
  Administered 2020-12-27: 75 mL via INTRAVENOUS

## 2021-03-13 ENCOUNTER — Ambulatory Visit
Admission: RE | Admit: 2021-03-13 | Discharge: 2021-03-13 | Disposition: A | Discharge status: Home / Self Care | Payer: 59 | Source: Ambulatory Visit | Attending: Sports Medicine | Admitting: Sports Medicine

## 2021-03-13 ENCOUNTER — Other Ambulatory Visit: Payer: Self-pay | Admitting: Sports Medicine

## 2021-03-13 DIAGNOSIS — M25561 Pain in right knee: Secondary | ICD-10-CM

## 2021-03-13 DIAGNOSIS — M25562 Pain in left knee: Secondary | ICD-10-CM

## 2022-02-27 ENCOUNTER — Telehealth (HOSPITAL_COMMUNITY): Payer: Self-pay | Admitting: Licensed Clinical Social Worker

## 2022-02-27 NOTE — Telephone Encounter (Signed)
Cln returned pt's call and oriented pt to PHP. Pt expressed concerns about PHP schedule and virtual environment. Cln provided pt with [facility] phone number for pt to call to see if that will work better for her. Pt also stated she is looking for a new psychiatric provider and stated she would be fine coming to [place]. She requested a to be contacted at 10 am on Monday. Cln sent email to front office staff informing them of same. Cln also recommended [facility] for med man and told pt she can be scheduled for [facility] with this PHP if it will work for her schedule. Cln encouraged pt to call PHP number if she runs into any barriers and needs assistance. Pt denied SI and cln provided crisis resources ([address], ED, [place]).

## 2022-03-12 ENCOUNTER — Other Ambulatory Visit (HOSPITAL_BASED_OUTPATIENT_CLINIC_OR_DEPARTMENT_OTHER): Payer: Self-pay | Admitting: Family Medicine

## 2022-03-12 ENCOUNTER — Encounter (HOSPITAL_BASED_OUTPATIENT_CLINIC_OR_DEPARTMENT_OTHER): Payer: Self-pay

## 2022-03-12 ENCOUNTER — Ambulatory Visit (HOSPITAL_BASED_OUTPATIENT_CLINIC_OR_DEPARTMENT_OTHER)
Admission: RE | Admit: 2022-03-12 | Discharge: 2022-03-12 | Disposition: A | Discharge status: Home / Self Care | Payer: 59 | Source: Ambulatory Visit | Attending: Family Medicine | Admitting: Family Medicine

## 2022-03-12 DIAGNOSIS — R1031 Right lower quadrant pain: Secondary | ICD-10-CM | POA: Diagnosis present

## 2022-03-12 MED ORDER — IOHEXOL 300 MG/ML  SOLN
75.0000 mL | Freq: Once | INTRAMUSCULAR | Status: AC | PRN
  Administered 2022-03-12: 75 mL via INTRAVENOUS

## 2022-03-30 ENCOUNTER — Encounter (HOSPITAL_COMMUNITY): Payer: Self-pay | Admitting: Psychiatry

## 2022-03-30 ENCOUNTER — Ambulatory Visit (HOSPITAL_BASED_OUTPATIENT_CLINIC_OR_DEPARTMENT_OTHER): Payer: 59 | Admitting: Psychiatry

## 2022-03-30 VITALS — Wt 168.0 lb

## 2022-03-30 DIAGNOSIS — F4310 Post-traumatic stress disorder, unspecified: Secondary | ICD-10-CM | POA: Diagnosis not present

## 2022-03-30 DIAGNOSIS — F419 Anxiety disorder, unspecified: Secondary | ICD-10-CM | POA: Diagnosis not present

## 2022-03-30 DIAGNOSIS — F319 Bipolar disorder, unspecified: Secondary | ICD-10-CM | POA: Diagnosis not present

## 2022-03-30 MED ORDER — QUETIAPINE FUMARATE 300 MG PO TABS: 300.0000 mg | ORAL_TABLET | Freq: Every day | ORAL | 0 refills | Status: DC

## 2022-03-30 MED ORDER — DIVALPROEX SODIUM ER 500 MG PO TB24: 1000.0000 mg | ORAL_TABLET | Freq: Every day | ORAL | 0 refills | Status: DC
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
## 2022-04-16 ENCOUNTER — Other Ambulatory Visit: Payer: Self-pay | Admitting: Family Medicine

## 2022-04-16 ENCOUNTER — Ambulatory Visit
Admission: RE | Admit: 2022-04-16 | Discharge: 2022-04-16 | Disposition: A | Discharge status: Home / Self Care | Payer: 59 | Source: Ambulatory Visit | Attending: Family Medicine | Admitting: Family Medicine

## 2022-04-16 DIAGNOSIS — R058 Other specified cough: Secondary | ICD-10-CM

## 2022-04-20 ENCOUNTER — Telehealth (HOSPITAL_BASED_OUTPATIENT_CLINIC_OR_DEPARTMENT_OTHER): Payer: 59 | Admitting: Psychiatry

## 2022-04-20 ENCOUNTER — Encounter (HOSPITAL_COMMUNITY): Payer: Self-pay | Admitting: Psychiatry

## 2022-04-20 VITALS — Wt 172.0 lb

## 2022-04-20 DIAGNOSIS — Z635 Disruption of family by separation and divorce: Secondary | ICD-10-CM | POA: Diagnosis not present

## 2022-04-20 DIAGNOSIS — F419 Anxiety disorder, unspecified: Secondary | ICD-10-CM | POA: Diagnosis not present

## 2022-04-20 DIAGNOSIS — F4310 Post-traumatic stress disorder, unspecified: Secondary | ICD-10-CM

## 2022-04-20 DIAGNOSIS — F319 Bipolar disorder, unspecified: Secondary | ICD-10-CM | POA: Diagnosis not present

## 2022-04-20 MED ORDER — DIVALPROEX SODIUM ER 500 MG PO TB24: 1000.0000 mg | ORAL_TABLET | Freq: Every day | ORAL | 0 refills | Status: DC

## 2022-04-20 MED ORDER — LORAZEPAM 0.5 MG PO TABS: 0.5000 mg | ORAL_TABLET | ORAL | 0 refills | Status: DC | PRN

## 2022-04-20 MED ORDER — QUETIAPINE FUMARATE 300 MG PO TABS: 300.0000 mg | ORAL_TABLET | Freq: Every day | ORAL | 0 refills | Status: DC

## 2022-04-20 NOTE — Progress Notes (Signed)
[facility] MD Virtual Progress Note   Patient Location: Home Provider Location: Home Office  I connect with patient by telephone and verified that I am speaking with correct person by using two identifiers. I discussed the limitations of evaluation and management by telemedicine and the availability of in person appointments. I also discussed with the patient that there may be a patient responsible charge related to this service. The patient expressed understanding and agreed to proceed.  Chelsea [name] EL:[id number] 37 y.o.  04/20/2022 2:22 PM  History of Present Illness:  Patient is 37 year old Caucasian, employed female who was seen first time 4 weeks ago.  Today her video is not working and she cannot log into her my chart.  Session was done by phone.  She reported things were going okay until recently find out that her husband is seeing someone else.  The patient is very upset and did talk to her husband who admitted that he is seeing someone and not sure about the future of their marriage.  Patient told that hurts her feeling because she is still allowed him.  She even went to see his therapist with him but did not feel any change in his decision.  She admitted having crying spells, worries, scared because she has 36-year-old and 36 year old.  She admitted not have enough support and lately she is writing everything and diary about her emotions.  She denies any suicidal thoughts but admitted feeling hopeless and helpless.  She admitted feeling overwhelmed and she had missed the work because she could not function.  Patient told she is not sure what happened to her husband because he is keeping her distance from her and he has no emotions when she tried to talk to him.  She also admitted last night have 3 shots of whiskey because she could not sleep.  She also had taken 3 Ativan since the last visit which she has not taken in a while.  She denies any hallucination, paranoia but admitted  nightmares and poor sleep.  She has a history of bipolar disorder, PTSD, anxiety but lately more anxious and nervous and depressed.  She is compliant with her medication which is Seroquel 300 mg at bedtime, Depakote 1000 mg at bedtime.  She recently had a visit to her daughter and her labs were drawn.  Her Depakote level is 82.  Her other labs are normal.  Patient works as a Actuary.  Past Psychiatric History: No history of suicidal attempt, inpatient treatment, psychosis.  H/O mania, depression, PTSD, postpartum depression.  Saw therapist Perley Jain for postpartum depression at [place], [place] [facility] [place].  As per EMR given Lamictal 300 mg, venlafaxine 37.5 mg, Abilify 10 mg, Zoloft 50 mg but do not remember the details.  Lexapro caused suicidal thoughts.  Also tried Xanax and hydroxyzine but do not remember.  No history of legal issues, DUI, illegal substance use.    Outpatient Encounter Medications as of 04/20/2022  Medication Sig   acetaminophen (TYLENOL) 500 MG tablet Take 500-1,000 mg by mouth every 6 (six) hours as needed (for pain or headaches).   albuterol (PROVENTIL HFA;VENTOLIN HFA) 108 (90 Base) MCG/ACT inhaler Inhale 1-2 puffs into the lungs every 6 (six) hours as needed for wheezing or shortness of breath.   divalproex (DEPAKOTE ER) 500 MG 24 hr tablet Take 2 tablets (1,000 mg total) by mouth at bedtime.   LORazepam (ATIVAN) 0.5 MG tablet Take 1 tablet by mouth as needed.  QUEtiapine (SEROQUEL) 300 MG tablet Take 1 tablet (300 mg total) by mouth at bedtime.   No facility-administered encounter medications on file as of 04/20/2022.    No results found for this or any previous visit (from the past 2160 hour(s)).   Psychiatric Specialty Exam: Physical Exam  Review of Systems  Weight 172 lb (78 kg), last menstrual period 04/09/2022, unknown if currently breastfeeding.There is no height or weight on file to calculate BMI.  General Appearance: NA  Eye  Contact:  NA  Speech:  Slow  Volume:  Decreased  Mood:  Anxious, Depressed, Dysphoric, and Hopeless  Affect:  NA  Thought Process:  Goal Directed  Orientation:  Full (Time, Place, and Person)  Thought Content:  Rumination  Suicidal Thoughts:  No  Homicidal Thoughts:  No  Memory:  Immediate;   Good Recent;   Good Remote;   Good  Judgement:  Intact  Insight:  Present  Psychomotor Activity:  Decreased  Concentration:  Concentration: Fair and Attention Span: Fair  Recall:  Good  Fund of Knowledge:  Good  Language:  Good  Akathisia:  No  Handed:  Right  AIMS (if indicated):     Assets:  Communication Skills Desire for Improvement Housing Talents/Skills Transportation  ADL's:  Intact  Cognition:  WNL  Sleep: Fair     Assessment/Plan: Bipolar I disorder (HCC) - Plan: divalproex (DEPAKOTE ER) 500 MG 24 hr tablet, QUEtiapine (SEROQUEL) 300 MG tablet  Anxiety - Plan: divalproex (DEPAKOTE ER) 500 MG 24 hr tablet, QUEtiapine (SEROQUEL) 300 MG tablet, LORazepam (ATIVAN) 0.5 MG tablet  PTSD (post-traumatic stress disorder) - Plan: divalproex (DEPAKOTE ER) 500 MG 24 hr tablet, QUEtiapine (SEROQUEL) 300 MG tablet  Marital estrangement  Discussed psychosocial stressors especially recent marriage estrangement.  Discussed not to drink alcohol as she is taking psychotropic medication and may cause interaction with the medication.  She agreed with the plan.  She does not have Ativan as last Ativan was filled by previous doctor 6 months ago.  I recommend she can take the Ativan but she is having a lot of anxiety.  We discussed IOP and she agreed to give a try.  Patient admitted that it is helpful if she can talk to someone as lately feeling overwhelmed.  She denies any suicidal thoughts, hallucination, paranoia.  We will refer her to IOP.  Continue Seroquel 300 mg at bedtime, Depakote 1000 mg at bedtime and Ativan 0.5 to take as needed.  We will follow-up once she finished IOP.  Message sent to  IOP staff to call her back.   Follow Up Instructions:     I discussed the assessment and treatment plan with the patient. The patient was provided an opportunity to ask questions and all were answered. The patient agreed with the plan and demonstrated an understanding of the instructions.   The patient was advised to call back or seek an in-person evaluation if the symptoms worsen or if the condition fails to improve as anticipated.    Collaboration of Care: Other provider involved in patient's care AEB notes available in epic to review.  Patient/Guardian was advised Release of Information must be obtained prior to any record release in order to collaborate their care with an outside provider. Patient/Guardian was advised if they have not already done so to contact the registration department to sign all necessary forms in order for [place] to release information regarding their care.   Consent: Patient/Guardian gives verbal consent for treatment and assignment of benefits  for services provided during this visit. Patient/Guardian expressed understanding and agreed to proceed.     I provided 30 minutes of non face to face time during this encounter.  Kathlee Nations, MD 04/20/2022

## 2022-04-21 ENCOUNTER — Telehealth (HOSPITAL_COMMUNITY): Payer: Self-pay | Admitting: Licensed Clinical Social Worker

## 2022-05-01 ENCOUNTER — Other Ambulatory Visit (HOSPITAL_COMMUNITY): Payer: Self-pay | Admitting: *Deleted

## 2022-05-01 DIAGNOSIS — F419 Anxiety disorder, unspecified: Secondary | ICD-10-CM

## 2022-05-01 DIAGNOSIS — F4310 Post-traumatic stress disorder, unspecified: Secondary | ICD-10-CM

## 2022-05-01 DIAGNOSIS — F319 Bipolar disorder, unspecified: Secondary | ICD-10-CM

## 2022-05-01 MED ORDER — DIVALPROEX SODIUM ER 500 MG PO TB24: 1000.0000 mg | ORAL_TABLET | Freq: Every day | ORAL | 0 refills | Status: DC

## 2022-05-01 MED ORDER — QUETIAPINE FUMARATE 300 MG PO TABS: 300.0000 mg | ORAL_TABLET | Freq: Every day | ORAL | 0 refills | Status: AC

## 2022-08-03 ENCOUNTER — Encounter: Payer: Self-pay | Admitting: Physician Assistant

## 2022-08-04 ENCOUNTER — Other Ambulatory Visit: Payer: Self-pay | Admitting: Physician Assistant

## 2022-08-04 DIAGNOSIS — M952 Other acquired deformity of head: Secondary | ICD-10-CM

## 2022-08-25 ENCOUNTER — Ambulatory Visit
Admission: RE | Admit: 2022-08-25 | Discharge: 2022-08-25 | Disposition: A | Discharge status: Home / Self Care | Payer: 59 | Source: Ambulatory Visit | Attending: Physician Assistant | Admitting: Physician Assistant

## 2022-08-25 ENCOUNTER — Other Ambulatory Visit: Payer: Self-pay | Admitting: Physician Assistant

## 2022-08-25 DIAGNOSIS — M952 Other acquired deformity of head: Secondary | ICD-10-CM

## 2022-08-26 ENCOUNTER — Other Ambulatory Visit: Payer: Self-pay | Admitting: Physician Assistant

## 2022-08-26 ENCOUNTER — Telehealth (HOSPITAL_COMMUNITY): Payer: Self-pay | Admitting: *Deleted

## 2022-08-26 DIAGNOSIS — M952 Other acquired deformity of head: Secondary | ICD-10-CM

## 2022-08-26 NOTE — Telephone Encounter (Signed)
I only saw twice. You can provide 15 days supply until she had appointment.

## 2022-08-26 NOTE — Telephone Encounter (Signed)
Pt LVM requesting a refill of Depakote "both doses". Per last two visit notes pt should be on Divalproex ER 500 mg (total dose 1,000 mg total) at bedtime. Pt last seen on 04/20/22 with no future appointments scheduled. Writer returned pt call but had to LVM advising pt to make an appointment and that we can bridge then. FYI.

## 2022-08-27 NOTE — Telephone Encounter (Signed)
Ok. No appointment yet.

## 2022-10-27 HISTORY — PX: NASAL SEPTUM SURGERY: SHX37

## 2023-06-07 LAB — OB RESULTS CONSOLE GC/CHLAMYDIA
Chlamydia: NEGATIVE
Neisseria Gonorrhea: NEGATIVE

## 2023-06-18 ENCOUNTER — Inpatient Hospital Stay (HOSPITAL_COMMUNITY)
Admission: AD | Admit: 2023-06-18 | Discharge: 2023-06-18 | Disposition: A | Discharge status: Home / Self Care | Payer: Self-pay | Attending: Student | Admitting: Student

## 2023-06-18 ENCOUNTER — Inpatient Hospital Stay (HOSPITAL_COMMUNITY): Payer: Self-pay

## 2023-06-18 ENCOUNTER — Encounter (HOSPITAL_COMMUNITY): Payer: Self-pay | Admitting: *Deleted

## 2023-06-18 DIAGNOSIS — O3411 Maternal care for benign tumor of corpus uteri, first trimester: Secondary | ICD-10-CM | POA: Insufficient documentation

## 2023-06-18 DIAGNOSIS — O219 Vomiting of pregnancy, unspecified: Secondary | ICD-10-CM | POA: Insufficient documentation

## 2023-06-18 DIAGNOSIS — Z3A01 Less than 8 weeks gestation of pregnancy: Secondary | ICD-10-CM | POA: Insufficient documentation

## 2023-06-18 DIAGNOSIS — D259 Leiomyoma of uterus, unspecified: Secondary | ICD-10-CM | POA: Insufficient documentation

## 2023-06-18 HISTORY — DX: Anxiety disorder, unspecified: F41.9

## 2023-06-18 HISTORY — DX: Urinary tract infection, site not specified: N39.0

## 2023-06-18 HISTORY — DX: Other complications of anesthesia, initial encounter: T88.59XA

## 2023-06-18 HISTORY — DX: Bipolar disorder, unspecified: F31.9

## 2023-06-18 HISTORY — DX: Depression, unspecified: F32.A

## 2023-06-18 LAB — URINALYSIS, ROUTINE W REFLEX MICROSCOPIC
Bilirubin Urine: NEGATIVE
Glucose, UA: NEGATIVE mg/dL
Hgb urine dipstick: NEGATIVE
Ketones, ur: NEGATIVE mg/dL
Leukocytes,Ua: NEGATIVE
Nitrite: NEGATIVE
Protein, ur: NEGATIVE mg/dL
Specific Gravity, Urine: 1.026 (ref 1.005–1.030)
pH: 5 (ref 5.0–8.0)

## 2023-06-18 LAB — COMPREHENSIVE METABOLIC PANEL WITH GFR
ALT: 12 U/L (ref 0–44)
AST: 15 U/L (ref 15–41)
Albumin: 3.7 g/dL (ref 3.5–5.0)
Alkaline Phosphatase: 39 U/L (ref 38–126)
Anion gap: 6 (ref 5–15)
BUN: 6 mg/dL (ref 6–20)
CO2: 24 mmol/L (ref 22–32)
Calcium: 8.9 mg/dL (ref 8.9–10.3)
Chloride: 105 mmol/L (ref 98–111)
Creatinine, Ser: 0.84 mg/dL (ref 0.44–1.00)
GFR, Estimated: >60 mL/min (ref >60)
Glucose, Bld: 93 mg/dL (ref 70–99)
Potassium: 4.1 mmol/L (ref 3.5–5.1)
Sodium: 135 mmol/L (ref 135–145)
Total Bilirubin: 0.5 mg/dL (ref 0.0–1.2)
Total Protein: 6.7 g/dL (ref 6.5–8.1)

## 2023-06-18 LAB — CBC
HCT: 38.9 % (ref 36.0–46.0)
Hemoglobin: 13.5 g/dL (ref 12.0–15.0)
MCH: 30.3 pg (ref 26.0–34.0)
MCHC: 34.7 g/dL (ref 30.0–36.0)
MCV: 87.4 fL (ref 80.0–100.0)
Platelets: 235 10*3/uL (ref 150–400)
RBC: 4.45 MIL/uL (ref 3.87–5.11)
RDW: 13.7 % (ref 11.5–15.5)
WBC: 10.7 10*3/uL — ABNORMAL HIGH (ref 4.0–10.5)
nRBC: 0 % (ref 0.0–0.2)

## 2023-06-18 LAB — ABO/RH
ABO/RH(D): B NEG
Antibody Screen: NEGATIVE

## 2023-06-18 LAB — HCG, QUANTITATIVE, PREGNANCY: hCG, Beta Chain, Quant, S: 72333 m[IU]/mL — ABNORMAL HIGH (ref <5)

## 2023-06-18 MED ORDER — SCOPOLAMINE 1 MG/3DAYS TD PT72
1.0000 | MEDICATED_PATCH | TRANSDERMAL | Status: DC
  Administered 2023-06-18: 1.5 mg via TRANSDERMAL
  Filled 2023-06-18: qty 1

## 2023-06-18 MED ORDER — FAMOTIDINE 20 MG PO TABS: 20.0000 mg | ORAL_TABLET | Freq: Two times a day (BID) | ORAL | 0 refills | Status: DC

## 2023-06-18 MED ORDER — FAMOTIDINE 20 MG PO TABS
20.0000 mg | ORAL_TABLET | Freq: Once | ORAL | Status: AC
  Administered 2023-06-18: 20 mg via ORAL
  Filled 2023-06-18: qty 1

## 2023-06-18 MED ORDER — SCOPOLAMINE 1 MG/3DAYS TD PT72: 1.0000 | MEDICATED_PATCH | TRANSDERMAL | 0 refills | Status: DC

## 2023-06-18 MED ORDER — METOCLOPRAMIDE HCL 10 MG PO TABS
10.0000 mg | ORAL_TABLET | Freq: Once | ORAL | Status: AC
  Administered 2023-06-18: 10 mg via ORAL
  Filled 2023-06-18: qty 1

## 2023-06-18 MED ORDER — ONDANSETRON 4 MG PO TBDP
4.0000 mg | ORAL_TABLET | Freq: Once | ORAL | Status: AC
  Administered 2023-06-18: 4 mg via ORAL
  Filled 2023-06-18: qty 1

## 2023-06-18 MED ORDER — ACETAMINOPHEN 500 MG PO TABS
1000.0000 mg | ORAL_TABLET | Freq: Once | ORAL | Status: AC
  Administered 2023-06-18: 1000 mg via ORAL
  Filled 2023-06-18: qty 2

## 2023-06-18 NOTE — Discharge Instructions (Addendum)
 NAUSEA: If you are not doing so already, start vitamin B6 daily supplement, ginger, small meals every 1-2 hours focusing on protein and bland foods. Keeping your blood sugar from being too low helps with nausea. Start taking famotidine (Pepcid) 20 mg once a day to control stomach acid production, which often helps with nausea as well. Use TUMS as needed for indigestion/heartburn. You can place a new scopolamine patch once every 72 hours. You may use either the Phenergan previously prescribed as needed for breakthrough nausea. If you cannot keep anything down, you can place Phenergan intravaginally.  HEADACHE/BACK PAIN: You can take Tylenol  for pain during pregnancy. 500 mg every 4 hours OR 1000 mg every 6 hours.

## 2023-06-18 NOTE — MAU Provider Note (Signed)
 None     S [name] is a 38 y.o. [id number] pregnant/non-pregnant female at [redacted]w[redacted]d who presents to MAU today with complaint of nausea and vomiting. She states that she has not been able to keep anything down. Promethazine has not been effective. She reports lower abdominal and back pain as well, and has a 7/10 headache.  Receives care at [facility]. Prenatal records reviewed.  Pertinent items noted in HPI and remainder of comprehensive ROS otherwise negative.   O BP 127/79 (BP Location: Right Arm)   Pulse 80   Temp 97.9 F (36.6 C) (Oral)   Resp 15   Ht 5\' 3"  (1.6 m)   Wt 82.4 kg   LMP  (LMP Unknown) Comment: irreg cycles, wasn't tracking  SpO2 100%   BMI 32.17 kg/m  Physical Exam Vitals reviewed.  Constitutional:      General: She is not in acute distress.    Appearance: She is well-developed. She is not diaphoretic.  Eyes:     General: No scleral icterus. Pulmonary:     Effort: Pulmonary effort is normal. No respiratory distress.  Skin:    General: Skin is warm and dry.  Neurological:     Mental Status: She is alert.     Coordination: Coordination normal.    Results for orders placed or performed during the hospital encounter of 06/18/23 (from the past 24 hours)  Urinalysis, Routine w reflex microscopic -Urine, Clean Catch     Status: Abnormal   Collection Time: 06/18/23  8:30 AM  Result Value Ref Range   Color, Urine YELLOW YELLOW   APPearance HAZY (A) CLEAR   Specific Gravity, Urine 1.026 1.005 - 1.030   pH 5.0 5.0 - 8.0   Glucose, UA NEGATIVE NEGATIVE mg/dL   Hgb urine dipstick NEGATIVE NEGATIVE   Bilirubin Urine NEGATIVE NEGATIVE   Ketones, ur NEGATIVE NEGATIVE mg/dL   Protein, ur NEGATIVE NEGATIVE mg/dL   Nitrite NEGATIVE NEGATIVE   Leukocytes,Ua NEGATIVE NEGATIVE  ABO/Rh     Status: None   Collection Time: 06/18/23  8:59 AM  Result Value Ref Range   ABO/RH(D) B NEG    Antibody Screen      NEG Performed at [facility], [address], [place], [place] [address]   CBC     Status: Abnormal   Collection Time: 06/18/23  9:01 AM  Result Value Ref Range   WBC 10.7 (H) 4.0 - 10.5 K/uL   RBC 4.45 3.87 - 5.11 MIL/uL   Hemoglobin 13.5 12.0 - 15.0 g/dL   HCT 72.5 36.6 - 44.0 %   MCV 87.4 80.0 - 100.0 fL   MCH 30.3 26.0 - 34.0 pg   MCHC 34.7 30.0 - 36.0 g/dL   RDW 34.7 42.5 - 95.6 %   Platelets 235 150 - 400 K/uL   nRBC 0.0 0.0 - 0.2 %  Comprehensive metabolic panel with GFR     Status: None   Collection Time: 06/18/23  9:01 AM  Result Value Ref Range   Sodium 135 135 - 145 mmol/L   Potassium 4.1 3.5 - 5.1 mmol/L   Chloride 105 98 - 111 mmol/L   CO2 24 22 - 32 mmol/L   Glucose, Bld 93 70 - 99 mg/dL   BUN 6 6 - 20 mg/dL   Creatinine, Ser 3.87 0.44 - 1.00 mg/dL   Calcium 8.9 8.9 - 56.4 mg/dL   Total Protein 6.7 6.5 - 8.1 g/dL   Albumin 3.7 3.5 -  5.0 g/dL   AST 15 15 - 41 U/L   ALT 12 0 - 44 U/L   Alkaline Phosphatase 39 38 - 126 U/L   Total Bilirubin 0.5 0.0 - 1.2 mg/dL   GFR, Estimated >16 >10 mL/min   Anion gap 6 5 - 15  hCG, quantitative, pregnancy     Status: Abnormal   Collection Time: 06/18/23  9:01 AM  Result Value Ref Range   hCG, Beta Chain, Quant, S 72,333 (H) <5 mIU/mL    MDM: MAU Course: -Vital signs within normal limits.  -UA and CMP negative for dehydration or electrolyte abnormalities. Renal function normal. -CBC within normal limits.  -Zofran  and Reglan did not offer much improvement. Trial scopolamine, pt has taken meclizine and dimenhydrinate in the past and tolerated well without reactions. -Tylenol  for headache.  A 1. Nausea/vomiting in pregnancy (Primary) - Discharge patient  2. [redacted] weeks gestation of pregnancy - Discharge patient   Medical screening exam complete  P Discharge from MAU in stable condition with first trimester return precautions Follow up at [facility] as scheduled for ongoing prenatal care May use Phenergan PO or intravaginally. Prescription sent for  scopalamine. Start Pepcid 20 mg daily Start vitamin B6 supplement and nonpharmacological interventions to improve nausea  No future appointments. Allergies as of 06/18/2023       Reactions   Hydromet [hydrocodone  Bit-homatrop Mbr] Anaphylaxis, Itching, Swelling   Patient states that her throat closes   Morphine  And Codeine Itching, Other (See Comments)   Makes entire burn and itch SEVERELY; "clawed" her skin   Penicillins Palpitations   Has patient had a PCN reaction causing immediate rash, facial/tongue/throat swelling, SOB or lightheadedness with hypotension: Yes Has patient had a PCN reaction causing severe rash involving mucus membranes or skin necrosis: No Has patient had a PCN reaction that required hospitalization: No Has patient had a PCN reaction occurring within the last 10 years: Yes If all of the above answers are "NO", then may proceed with Cephalosporin use.        Medication List     TAKE these medications    acetaminophen  500 MG tablet Commonly known as: TYLENOL  Take 500-1,000 mg by mouth every 6 (six) hours as needed (for pain or headaches).   albuterol  108 (90 Base) MCG/ACT inhaler Commonly known as: VENTOLIN  HFA Inhale 1-2 puffs into the lungs every 6 (six) hours as needed for wheezing or shortness of breath.   divalproex  500 MG 24 hr tablet Commonly known as: DEPAKOTE  ER Take 2 tablets (1,000 mg total) by mouth at bedtime.   famotidine 20 MG tablet Commonly known as: PEPCID Take 1 tablet (20 mg total) by mouth 2 (two) times daily.   LORazepam  0.5 MG tablet Commonly known as: ATIVAN  Take 1 tablet (0.5 mg total) by mouth as needed.   Prenatal Adult Gummy/DHA/FA 0.4-25 MG Chew Chew 1 tablet by mouth.   promethazine 12.5 MG tablet Commonly known as: PHENERGAN Take 12.5 mg by mouth every 6 (six) hours as needed for nausea or vomiting.   QUEtiapine  300 MG tablet Commonly known as: SEROQUEL  Take 1 tablet (300 mg total) by mouth at bedtime.    scopolamine 1 MG/3DAYS Commonly known as: TRANSDERM-SCOP Place 1 patch (1.5 mg total) onto the skin every 3 (three) days.      Noreene Bearded, PA

## 2023-06-18 NOTE — MAU Note (Signed)
 Chelsea [name] is a 38 y.o.  here in MAU reporting: morning sickness has been so severe, she can not keep anything down.  Has not saliva in her mouth.  Was given promethizine, is taking as prescribed, doesn't feel like it is really working.  Feels like she is shaking on the inside and has been having headaches and this "warm fuzzy feeling in her head".  Having quite a bit of pain in lower abd and lower back.  LMP: unknown. Onset of complaint: ongoing Pain score: moderate, back is a little worse, HA 7 Vitals:   06/18/23 0827  BP: (!) 131/90  Pulse: 81  Resp: 15  Temp: 97.9 F (36.6 C)  SpO2: 100%      Lab orders placed from triage:  urine

## 2023-06-29 ENCOUNTER — Other Ambulatory Visit: Payer: Self-pay | Admitting: Family Medicine

## 2023-07-07 ENCOUNTER — Other Ambulatory Visit: Payer: Self-pay | Admitting: Family Medicine

## 2023-07-08 ENCOUNTER — Inpatient Hospital Stay (HOSPITAL_COMMUNITY)

## 2023-07-08 ENCOUNTER — Encounter (HOSPITAL_COMMUNITY): Payer: Self-pay | Admitting: Obstetrics

## 2023-07-08 ENCOUNTER — Observation Stay (HOSPITAL_COMMUNITY)
Admission: AD | Admit: 2023-07-08 | Discharge: 2023-07-09 | Disposition: A | Discharge status: Home / Self Care | Attending: Obstetrics | Admitting: Obstetrics

## 2023-07-08 ENCOUNTER — Other Ambulatory Visit: Payer: Self-pay

## 2023-07-08 DIAGNOSIS — O99511 Diseases of the respiratory system complicating pregnancy, first trimester: Secondary | ICD-10-CM | POA: Insufficient documentation

## 2023-07-08 DIAGNOSIS — O99351 Diseases of the nervous system complicating pregnancy, first trimester: Secondary | ICD-10-CM | POA: Insufficient documentation

## 2023-07-08 DIAGNOSIS — R55 Syncope and collapse: Secondary | ICD-10-CM | POA: Diagnosis not present

## 2023-07-08 DIAGNOSIS — Z3A09 9 weeks gestation of pregnancy: Secondary | ICD-10-CM | POA: Insufficient documentation

## 2023-07-08 DIAGNOSIS — O219 Vomiting of pregnancy, unspecified: Secondary | ICD-10-CM | POA: Diagnosis not present

## 2023-07-08 DIAGNOSIS — O99891 Other specified diseases and conditions complicating pregnancy: Secondary | ICD-10-CM

## 2023-07-08 DIAGNOSIS — R4182 Altered mental status, unspecified: Secondary | ICD-10-CM | POA: Diagnosis not present

## 2023-07-08 DIAGNOSIS — E7800 Pure hypercholesterolemia, unspecified: Secondary | ICD-10-CM | POA: Insufficient documentation

## 2023-07-08 DIAGNOSIS — R404 Transient alteration of awareness: Principal | ICD-10-CM

## 2023-07-08 DIAGNOSIS — F320 Major depressive disorder, single episode, mild: Secondary | ICD-10-CM | POA: Insufficient documentation

## 2023-07-08 DIAGNOSIS — R519 Headache, unspecified: Secondary | ICD-10-CM | POA: Diagnosis not present

## 2023-07-08 DIAGNOSIS — J45909 Unspecified asthma, uncomplicated: Secondary | ICD-10-CM | POA: Diagnosis not present

## 2023-07-08 DIAGNOSIS — F419 Anxiety disorder, unspecified: Secondary | ICD-10-CM

## 2023-07-08 DIAGNOSIS — O26891 Other specified pregnancy related conditions, first trimester: Principal | ICD-10-CM | POA: Insufficient documentation

## 2023-07-08 DIAGNOSIS — O99341 Other mental disorders complicating pregnancy, first trimester: Secondary | ICD-10-CM | POA: Insufficient documentation

## 2023-07-08 DIAGNOSIS — E860 Dehydration: Secondary | ICD-10-CM

## 2023-07-08 DIAGNOSIS — O99281 Endocrine, nutritional and metabolic diseases complicating pregnancy, first trimester: Secondary | ICD-10-CM

## 2023-07-08 DIAGNOSIS — F99 Mental disorder, not otherwise specified: Secondary | ICD-10-CM | POA: Insufficient documentation

## 2023-07-08 DIAGNOSIS — D251 Intramural leiomyoma of uterus: Secondary | ICD-10-CM | POA: Insufficient documentation

## 2023-07-08 DIAGNOSIS — F319 Bipolar disorder, unspecified: Secondary | ICD-10-CM | POA: Insufficient documentation

## 2023-07-08 LAB — URINALYSIS, ROUTINE W REFLEX MICROSCOPIC
Bilirubin Urine: NEGATIVE
Glucose, UA: 50 mg/dL — AB
Hgb urine dipstick: NEGATIVE
Ketones, ur: NEGATIVE mg/dL
Leukocytes,Ua: NEGATIVE
Nitrite: NEGATIVE
Protein, ur: NEGATIVE mg/dL
Specific Gravity, Urine: 1.008 (ref 1.005–1.030)
pH: 7 (ref 5.0–8.0)

## 2023-07-08 LAB — ETHANOL: Alcohol, Ethyl (B): <15 mg/dL (ref <15)

## 2023-07-08 LAB — COMPREHENSIVE METABOLIC PANEL WITH GFR
ALT: 10 U/L (ref 0–44)
AST: 15 U/L (ref 15–41)
Albumin: 3.4 g/dL — ABNORMAL LOW (ref 3.5–5.0)
Alkaline Phosphatase: 37 U/L — ABNORMAL LOW (ref 38–126)
Anion gap: 9 (ref 5–15)
BUN: 7 mg/dL (ref 6–20)
CO2: 19 mmol/L — ABNORMAL LOW (ref 22–32)
Calcium: 9.4 mg/dL (ref 8.9–10.3)
Chloride: 108 mmol/L (ref 98–111)
Creatinine, Ser: 0.77 mg/dL (ref 0.44–1.00)
GFR, Estimated: >60 mL/min (ref >60)
Glucose, Bld: 77 mg/dL (ref 70–99)
Potassium: 4 mmol/L (ref 3.5–5.1)
Sodium: 136 mmol/L (ref 135–145)
Total Bilirubin: 0.4 mg/dL (ref 0.0–1.2)
Total Protein: 6.4 g/dL — ABNORMAL LOW (ref 6.5–8.1)

## 2023-07-08 LAB — CBC
HCT: 40.7 % (ref 36.0–46.0)
Hemoglobin: 13.8 g/dL (ref 12.0–15.0)
MCH: 30.5 pg (ref 26.0–34.0)
MCHC: 33.9 g/dL (ref 30.0–36.0)
MCV: 90 fL (ref 80.0–100.0)
Platelets: 168 10*3/uL (ref 150–400)
RBC: 4.52 MIL/uL (ref 3.87–5.11)
RDW: 13.8 % (ref 11.5–15.5)
WBC: 12.2 10*3/uL — ABNORMAL HIGH (ref 4.0–10.5)
nRBC: 0 % (ref 0.0–0.2)

## 2023-07-08 LAB — MAGNESIUM: Magnesium: 2.1 mg/dL (ref 1.7–2.4)

## 2023-07-08 LAB — RAPID URINE DRUG SCREEN, HOSP PERFORMED
Amphetamines: NOT DETECTED
Barbiturates: NOT DETECTED
Benzodiazepines: NOT DETECTED
Cocaine: NOT DETECTED
Opiates: NOT DETECTED
Tetrahydrocannabinol: NOT DETECTED

## 2023-07-08 LAB — TROPONIN I (HIGH SENSITIVITY)
Troponin I (High Sensitivity): 3 ng/L (ref <18)
Troponin I (High Sensitivity): 4 ng/L (ref <18)

## 2023-07-08 LAB — GLUCOSE, CAPILLARY: Glucose-Capillary: 74 mg/dL (ref 70–99)

## 2023-07-08 LAB — LIPASE, BLOOD: Lipase: 32 U/L (ref 11–51)

## 2023-07-08 LAB — AMMONIA: Ammonia: 31 umol/L (ref 9–35)

## 2023-07-08 LAB — AMYLASE: Amylase: 45 U/L (ref 28–100)

## 2023-07-08 MED ORDER — LACTATED RINGERS IV BOLUS
1000.0000 mL | Freq: Once | INTRAVENOUS | Status: AC
  Administered 2023-07-08: 1000 mL via INTRAVENOUS

## 2023-07-08 MED ORDER — QUETIAPINE FUMARATE ER 300 MG PO TB24
300.0000 mg | ORAL_TABLET | Freq: Every day | ORAL | Status: DC
  Filled 2023-07-08: qty 1

## 2023-07-08 MED ORDER — METOCLOPRAMIDE HCL 10 MG PO TABS: 10.0000 mg | ORAL_TABLET | Freq: Three times a day (TID) | ORAL | Status: DC | PRN

## 2023-07-08 MED ORDER — LACTATED RINGERS IV SOLN: INTRAVENOUS | Status: DC

## 2023-07-08 MED ORDER — LABETALOL HCL 5 MG/ML IV SOLN: 20.0000 mg | INTRAVENOUS | Status: DC | PRN

## 2023-07-08 MED ORDER — ACETAMINOPHEN-CAFFEINE 500-65 MG PO TABS
2.0000 | ORAL_TABLET | Freq: Once | ORAL | Status: AC
  Administered 2023-07-08: 2 via ORAL
  Filled 2023-07-08: qty 2

## 2023-07-08 MED ORDER — DIPHENHYDRAMINE HCL 50 MG/ML IJ SOLN
50.0000 mg | Freq: Once | INTRAMUSCULAR | Status: AC
  Administered 2023-07-08: 50 mg via INTRAVENOUS
  Filled 2023-07-08: qty 1

## 2023-07-08 MED ORDER — LABETALOL HCL 5 MG/ML IV SOLN: 80.0000 mg | INTRAVENOUS | Status: DC | PRN

## 2023-07-08 MED ORDER — CALCIUM CARBONATE ANTACID 500 MG PO CHEW: 2.0000 | CHEWABLE_TABLET | ORAL | Status: DC | PRN

## 2023-07-08 MED ORDER — LABETALOL HCL 5 MG/ML IV SOLN: 40.0000 mg | INTRAVENOUS | Status: DC | PRN

## 2023-07-08 MED ORDER — SODIUM CHLORIDE 0.9 % IV SOLN: 8.0000 mg | Freq: Three times a day (TID) | INTRAVENOUS | Status: DC | PRN

## 2023-07-08 MED ORDER — ACETAMINOPHEN 500 MG PO TABS
1000.0000 mg | ORAL_TABLET | Freq: Four times a day (QID) | ORAL | Status: DC | PRN
  Administered 2023-07-09: 1000 mg via ORAL
  Filled 2023-07-08: qty 2

## 2023-07-08 MED ORDER — ONDANSETRON HCL 4 MG/2ML IJ SOLN
4.0000 mg | Freq: Once | INTRAMUSCULAR | Status: AC
  Administered 2023-07-08: 4 mg via INTRAVENOUS
  Filled 2023-07-08: qty 2

## 2023-07-08 MED ORDER — FAMOTIDINE 20 MG PO TABS: 20.0000 mg | ORAL_TABLET | Freq: Two times a day (BID) | ORAL | Status: DC | PRN

## 2023-07-08 MED ORDER — HYDRALAZINE HCL 20 MG/ML IJ SOLN: 10.0000 mg | INTRAMUSCULAR | Status: DC | PRN

## 2023-07-08 MED ORDER — QUETIAPINE FUMARATE 300 MG PO TABS
300.0000 mg | ORAL_TABLET | Freq: Every day | ORAL | Status: DC
  Administered 2023-07-08: 300 mg via ORAL
  Filled 2023-07-08 (×2): qty 1

## 2023-07-08 NOTE — Significant Event (Signed)
 Rapid Response Event Note   Reason for Call :  Patient reported to be [redacted] weeks pregnant. Brought in by EMS from OBGYN appointment for syncopal episode.   Initial Focused Assessment:  Pt lying in bed, withdrawals upper arms equally to painful stimuli. Pt resistant to eye opening. PERRLA, 4mm. EOMI noted during attempts to assess pupils. Bilateral upper extremity shaking. Arms with no drift. Grips equal. Intermittently pt has responded with uh-huh to staff.  Skin warm, dry, pink.  No distress.  Breathing regular, unlabored.  VS: BP 113/84, HR 105, RR 18, SpO2 100% on room air  Interventions:  Assisted transport to CT for STAT CT Head  Plan of Care:  Per MD  Event Summary:  MD Notified: Dr. Onita Biles  Call Time: 1154 Arrival Time: 1200 End Time: 1235  Washington Hacker, RN

## 2023-07-08 NOTE — MAU Note (Signed)
 Patient 2 person assist to bathroom. Patient still presenting with shaking, drowsiness, and slow response to verbal stimulation. Patient not opening eyes. RN notified Lawanda Prayer, PA that patient required multi-person assist to get to the bathroom that RN did not feel comfortable with patient going home.   Edstrom, PA at bedside at 2143.

## 2023-07-08 NOTE — Consult Note (Signed)
 NEUROLOGY H&P NOTE   Date of service: July 08, 2023 Patient Name: Chelsea [name] MRN:  [id number] DOB:  [birth year] Chief Complaint: Syncope  History of Present Illness  Chelsea [name] is a 38 y.o. female with hx of severe anxiety and depression, bipolar affective disorder, hyperlipidemia, who is [redacted] weeks pregnant, was brought in from the doctor's office from her OB/GYN clinic after feeling dizzy and having an episode of loss of consciousness.  She is also been reporting a headache ongoing for a couple of days.  No prior history of migraines. Reports that she has been throwing up a lot this whole trimester and has been having a hard time keeping any food down. Reports she was in the doctor's office to make her regular appointment this morning and as she was at the window, started feeling dizzy.  She does not remember much of this episode. Reports feeling extremely anxious due to the constant nausea and vomiting. Rapid response evaluated the patient this afternoon and found her to be laying in bed with eyes closed but responding with minimal words and nonfocal exam. Head CT was done stat-was unremarkable  ROS  Comprehensive ROS performed and pertinent positives documented in the HPI.  Past History   Past Medical History:  Diagnosis Date   Anxiety    Asthma    Atypical chest pain    Back pain    Bipolar affective disorder (HCC)    Cancer (HCC)    cervical  HPV, age 72   Complication of anesthesia    woke up during surgery   Depression    Hyperlipidemia    UTI (urinary tract infection)    Past Surgical History:  Procedure Laterality Date   NASAL SEPTUM SURGERY  10/2022   TONSILLECTOMY     Family History  Problem Relation Age of Onset   Hypertension Mother    Hypertension Father    High Cholesterol Father    Heart block Father        partial   Social History   Socioeconomic History   Marital status: Married    Spouse name: Not on file   Number of children: Not on file    Years of education: Not on file   Highest education level: Not on file  Occupational History   Not on file  Tobacco Use   Smoking status: Never   Smokeless tobacco: Never  Vaping Use   Vaping status: Never Used  Substance and Sexual Activity   Alcohol use: Not Currently   Drug use: No   Sexual activity: Yes  Other Topics Concern   Not on file  Social History Narrative   Not on file   Social Drivers of Health   Financial Resource Strain: Not on file  Food Insecurity: Not on file  Transportation Needs: Not on file  Physical Activity: Not on file  Stress: Not on file  Social Connections: Not on file   Allergies  Allergen Reactions   Hydromet [Hydrocodone  Bit-Homatrop Mbr] Anaphylaxis, Itching and Swelling    Patient states that her throat closes   Morphine  And Codeine Itching and Other (See Comments)    Makes entire burn and itch SEVERELY; clawed her skin   Penicillins Palpitations    Has patient had a PCN reaction causing immediate rash, facial/tongue/throat swelling, SOB or lightheadedness with hypotension: Yes Has patient had a PCN reaction causing severe rash involving mucus membranes or skin necrosis: No Has patient had a PCN reaction that required hospitalization: No Has patient  had a PCN reaction occurring within the last 10 years: Yes If all of the above answers are NO, then may proceed with Cephalosporin use.     Medications   Medications Prior to Admission  Medication Sig Dispense Refill Last Dose/Taking   albuterol  (PROVENTIL  HFA;VENTOLIN  HFA) 108 (90 Base) MCG/ACT inhaler Inhale 1-2 puffs into the lungs every 6 (six) hours as needed for wheezing or shortness of breath.   Past Month   famotidine  (PEPCID ) 20 MG tablet TAKE 1 TABLET BY MOUTH 2 TIMES A DAY 30 tablet 0 Past Week   LORazepam  (ATIVAN ) 0.5 MG tablet Take 1 tablet (0.5 mg total) by mouth as needed. 15 tablet 0 Past Month   QUEtiapine  (SEROQUEL ) 300 MG tablet Take 1 tablet (300 mg total) by mouth  at bedtime. 30 tablet 0 07/07/2023 Bedtime   scopolamine  (TRANSDERM-SCOP) 1 MG/3DAYS Place 1 patch (1.5 mg total) onto the skin every 3 (three) days. 10 patch 0 07/08/2023   acetaminophen  (TYLENOL ) 500 MG tablet Take 500-1,000 mg by mouth every 6 (six) hours as needed (for pain or headaches).   Unknown   Prenatal MV & Min w/FA-DHA (PRENATAL ADULT GUMMY/DHA/FA) 0.4-25 MG CHEW Chew 1 tablet by mouth.   Unknown   promethazine (PHENERGAN) 12.5 MG tablet Take 12.5 mg by mouth every 6 (six) hours as needed for nausea or vomiting.   Unknown     Vitals   Vitals:   07/08/23 1515 07/08/23 1530 07/08/23 1600 07/08/23 1906  BP: 109/66 118/69 119/70 (!) 124/90  Pulse: 89 87 80 84  Resp:   15 18  Temp:    98.5 F (36.9 C)  TempSrc:    Oral  SpO2: 100% 100% 100% 100%     There is no height or weight on file to calculate BMI.  Physical Exam   General: Awake alert sitting on the corner of the bed with some discomfort due to nausea HEENT: Normocephalic dermatic Nose: Clear Neurologic exam Awake and oriented x 3, no dysarthria, no aphasia, diminished attention concentration somewhat.  Cranial nerves II to XII intact.  Motor examination with no drift in any of the 4 extremities.  Sensation intact to light touch.  No dysmetria on coordination exam.  Essentially normal neurological exam  Labs   CBC:  Recent Labs  Lab 07/08/23 1138  WBC 12.2*  HGB 13.8  HCT 40.7  MCV 90.0  PLT 168    Basic Metabolic Panel:  Lab Results  Component Value Date   NA 136 07/08/2023   K 4.0 07/08/2023   CO2 19 (L) 07/08/2023   GLUCOSE 77 07/08/2023   BUN 7 07/08/2023   CREATININE 0.77 07/08/2023   CALCIUM 9.4 07/08/2023   GFRNONAA >60 07/08/2023   GFRAA >60 07/17/2017   Lipid Panel: No results found for: LDLCALC HgbA1c: No results found for: HGBA1C Urine Drug Screen:     Component Value Date/Time   LABOPIA NONE DETECTED 07/08/2023 1302   COCAINSCRNUR NONE DETECTED 07/08/2023 1302   LABBENZ NONE  DETECTED 07/08/2023 1302   AMPHETMU NONE DETECTED 07/08/2023 1302   THCU NONE DETECTED 07/08/2023 1302   LABBARB NONE DETECTED 07/08/2023 1302    Alcohol Level     Component Value Date/Time   [facility] <15 07/08/2023 1248     CT Head without contrast(Personally reviewed): Normal  Impression   Chelsea [name] is a 38 y.o. female brought in from the doctor's office for an episode of what sounded like a syncopal episode.  Has been complaining of  ongoing nausea and vomiting throughout the duration of this 9-week pregnancy of hers. Has been more than usually anxious. Was on Depakote  at 1 point which has been discontinued during pregnancy. Neurologic exam nonfocal Has minimal headache-no red flags. CT head also unremarkable. Symptoms likely related to dehydration and anxiety.  Headache possibly also related to those.  Recommendations  Management of headache with a migraine cocktail of choice for the time of her pregnancy as appropriate by the OB team. I recommended a trial of IV Ativan  which would also help with some headache as well as help her sleep some. For outpatient treatment, I recommend that she talk to her therapist about starting lamotrigine which is safe in pregnancy and would be a good mood stabilizer. Rest of the medical and OB management per the primary team. This plan was communicated to Chelsea Easter, PA from the primary service Please call us  back with questions as needed. ______________________________________________________________________   Chelsea Prince, MD [facility]

## 2023-07-08 NOTE — MAU Note (Signed)
 Pt reports it being very bright and severe headache, room with low light at this time. Pt reports at US  fibroids found.

## 2023-07-08 NOTE — MAU Provider Note (Signed)
 Chief Complaint:  No chief complaint on file.   HPI   None     Chelsea [name] is a 38 y.o. [id number] at [redacted]w[redacted]d who presents to maternity admissions reporting syncope. BIB EMS, per EMS had syncopal episode in OB office this morning, MAU did not receive report from [facility]. Patient unable to provide history. Husband at bedside, notes that patient drove herself to [facility] office at around 0830, she sent him photos of US  from office at 0930, and OB office called him about patient passing out at 1020. Medical history significant for bipolar disorder, depression, anxiety, hypercholesteremia, asthma. Currently taking daily prenatal, famotidine , and Seroquel . Has prescriptions for prn albuterol , lorazepam , phenergan, and scopolamine . She has been wearing scopolamine  patches for the past several weeks for nausea, only recent medication change other than that was changing Depakote  to Seroquel  about 1 month ago. Patient responsive long enough to endorse lower abdominal pain, otherwise unable to provide any information.  Most history obtained from partner  Pregnancy Course: Receives care at [facility]. Called on-call physician Dr. Fulton Job for additional information and clarification. She was able to obtain following information from her partner who saw [place] for [facility] appointment today: patient complained of nausea/vomiting for the past 5 weeks though has not contacted her OB office about it prior. BP 110/80 in office. Well during office visit, but at check out office staff noticed that she looked pale and unwell and had her sit in the waiting area. While waiting, another patient who happened to be a nurse noted that the patient looked very unwell and had someone get the physician. The physician noted good tone and no loss of consciousness while in the office, but patient was giving inappropriate answers to questions and required sternal rub to remain alert enough to answer. EMS was called for transport, they  reported blood glucose of 90s and elevated blood pressures during transport.   Past Medical History:  Diagnosis Date   Anxiety    Asthma    Atypical chest pain    Back pain    Bipolar affective disorder (HCC)    Cancer (HCC)    cervical  HPV, age 15   Complication of anesthesia    woke up during surgery   Depression    Hyperlipidemia    UTI (urinary tract infection)    OB History  Gravida Para Term Preterm AB Living  3 2 1 1  2   SAB IAB Ectopic Multiple Live Births     0 2    # Outcome Date GA Lbr Len/2nd Weight Sex Type Anes PTL Lv  3 Current           2 Term 09/27/16 [redacted]w[redacted]d 04:04 / 00:22 2900 g F Vag-Spont EPI  LIV  1 Preterm 05/20/10   2268 g F  EPI Y LIV    Obstetric Comments  2012 PPROM   Past Surgical History:  Procedure Laterality Date   NASAL SEPTUM SURGERY  10/2022   TONSILLECTOMY     Family History  Problem Relation Age of Onset   Hypertension Mother    Hypertension Father    High Cholesterol Father    Heart block Father        partial   Social History   Tobacco Use   Smoking status: Never   Smokeless tobacco: Never  Vaping Use   Vaping status: Never Used  Substance Use Topics   Alcohol use: Not Currently   Drug use: No   Allergies  Allergen Reactions   Hydromet [Hydrocodone  Bit-Homatrop Mbr] Anaphylaxis, Itching and Swelling    Patient states that her throat closes   Morphine  And Codeine Itching and Other (See Comments)    Makes entire burn and itch SEVERELY; clawed her skin   Penicillins Palpitations    Has patient had a PCN reaction causing immediate rash, facial/tongue/throat swelling, SOB or lightheadedness with hypotension: Yes Has patient had a PCN reaction causing severe rash involving mucus membranes or skin necrosis: No Has patient had a PCN reaction that required hospitalization: No Has patient had a PCN reaction occurring within the last 10 years: Yes If all of the above answers are NO, then may proceed with Cephalosporin  use.    Medications Prior to Admission  Medication Sig Dispense Refill Last Dose/Taking   albuterol  (PROVENTIL  HFA;VENTOLIN  HFA) 108 (90 Base) MCG/ACT inhaler Inhale 1-2 puffs into the lungs every 6 (six) hours as needed for wheezing or shortness of breath.   Past Month   famotidine  (PEPCID ) 20 MG tablet TAKE 1 TABLET BY MOUTH 2 TIMES A DAY 30 tablet 0 Past Week   LORazepam  (ATIVAN ) 0.5 MG tablet Take 1 tablet (0.5 mg total) by mouth as needed. 15 tablet 0 Past Month   QUEtiapine  (SEROQUEL ) 300 MG tablet Take 1 tablet (300 mg total) by mouth at bedtime. 30 tablet 0 07/07/2023 Bedtime   scopolamine  (TRANSDERM-SCOP) 1 MG/3DAYS Place 1 patch (1.5 mg total) onto the skin every 3 (three) days. 10 patch 0 07/08/2023   acetaminophen  (TYLENOL ) 500 MG tablet Take 500-1,000 mg by mouth every 6 (six) hours as needed (for pain or headaches).   Unknown   Prenatal MV & Min w/FA-DHA (PRENATAL ADULT GUMMY/DHA/FA) 0.4-25 MG CHEW Chew 1 tablet by mouth.   Unknown   promethazine (PHENERGAN) 12.5 MG tablet Take 12.5 mg by mouth every 6 (six) hours as needed for nausea or vomiting.   Unknown    I have reviewed patient's Past Medical Hx, Surgical Hx, Family Hx, Social Hx, medications and allergies.   ROS  Pertinent items noted in HPI and remainder of comprehensive ROS otherwise negative.   PHYSICAL EXAM  Patient Vitals for the past 24 hrs:  BP Temp Temp src Pulse Resp SpO2  07/08/23 2201 133/88 -- -- 75 -- --  07/08/23 2146 (!) 142/103 -- -- 82 -- --  07/08/23 2141 (!) 146/102 -- -- 85 -- --  07/08/23 1906 (!) 124/90 98.5 F (36.9 C) Oral 84 18 100 %  07/08/23 1600 119/70 -- -- 80 15 100 %  07/08/23 1530 118/69 -- -- 87 -- 100 %  07/08/23 1515 109/66 -- -- 89 -- 100 %  07/08/23 1502 -- -- -- -- 15 100 %  07/08/23 1500 113/67 -- -- 86 -- 100 %  07/08/23 1445 110/68 -- -- 88 -- 100 %  07/08/23 1430 (!) 140/90 -- -- 95 16 100 %  07/08/23 1415 137/89 -- -- 92 -- 100 %  07/08/23 1400 132/88 -- -- 93 15 100 %   07/08/23 1348 137/84 -- -- 90 -- 100 %  07/08/23 1330 (!) 142/91 -- -- 98 16 100 %  07/08/23 1315 -- -- -- -- -- 100 %  07/08/23 1300 130/83 -- -- 88 14 100 %  07/08/23 1245 121/75 -- -- 97 -- 100 %  07/08/23 1231 122/79 -- -- (!) 106 15 --  07/08/23 1225 -- -- -- -- -- 100 %  07/08/23 1224 123/79 -- -- (!) 104 16 --  07/08/23  1201 113/84 -- -- (!) 105 15 --  07/08/23 1200 -- -- -- -- -- 100 %  07/08/23 1155 123/83 -- -- (!) 138 16 --  07/08/23 1150 -- -- -- -- -- 100 %  07/08/23 1148 (!) 205/184 -- -- (!) 181 -- --  07/08/23 1147 (!) 187/145 -- -- (!) 162 18 --  07/08/23 1145 (!) 185/163 -- -- 94 -- 95 %  07/08/23 1140 -- -- -- -- -- 99 %  07/08/23 1136 (!) 142/124 -- -- (!) 200 16 --  07/08/23 1135 -- -- -- -- -- 99 %  07/08/23 1130 -- -- -- -- -- 100 %  07/08/23 1125 -- -- -- -- -- 100 %  07/08/23 1121 122/68 98.3 F (36.8 C) Axillary 88 18 --  07/08/23 1115 -- -- -- -- -- 100 %    Constitutional: Well-developed, well-nourished female in no acute distress.  HEENT: atraumatic, normocephalic.  Cardiovascular: normal rate & rhythm, warm and well-perfused. No murmurs, no tachycardia on initial evaluation. Respiratory: normal effort, no problems with respiration noted. CTA bilaterally. GI: Abd soft, non-tender, non-distended MSK: Extremities nontender, no edema, normal ROM Skin: warm and dry. Acyanotic, no jaundice or pallor. Neurologic: lethargic, disoriented. Unresponsive until sternal rub. GCS 11 (GCS eye subscore is 2. GCS verbal subscore is 4. GCS motor subscore is 5.) Psychiatric: Patient largely unresponsive, speech quiet.  GU: no CVA tenderness  Labs: Results for orders placed or performed during the hospital encounter of 07/08/23 (from the past 24 hours)  CBC     Status: Abnormal   Collection Time: 07/08/23 11:38 AM  Result Value Ref Range   WBC 12.2 (H) 4.0 - 10.5 K/uL   RBC 4.52 3.87 - 5.11 MIL/uL   Hemoglobin 13.8 12.0 - 15.0 g/dL   HCT 09.8 11.9 - 14.7 %    MCV 90.0 80.0 - 100.0 fL   MCH 30.5 26.0 - 34.0 pg   MCHC 33.9 30.0 - 36.0 g/dL   RDW 82.9 56.2 - 13.0 %   Platelets 168 150 - 400 K/uL   nRBC 0.0 0.0 - 0.2 %  Comprehensive metabolic panel     Status: Abnormal   Collection Time: 07/08/23 11:38 AM  Result Value Ref Range   Sodium 136 135 - 145 mmol/L   Potassium 4.0 3.5 - 5.1 mmol/L   Chloride 108 98 - 111 mmol/L   CO2 19 (L) 22 - 32 mmol/L   Glucose, Bld 77 70 - 99 mg/dL   BUN 7 6 - 20 mg/dL   Creatinine, Ser 8.65 0.44 - 1.00 mg/dL   Calcium 9.4 8.9 - 78.4 mg/dL   Total Protein 6.4 (L) 6.5 - 8.1 g/dL   Albumin 3.4 (L) 3.5 - 5.0 g/dL   AST 15 15 - 41 U/L   ALT 10 0 - 44 U/L   Alkaline Phosphatase 37 (L) 38 - 126 U/L   Total Bilirubin 0.4 0.0 - 1.2 mg/dL   GFR, Estimated >69 >62 mL/min   Anion gap 9 5 - 15  Lipase, blood     Status: None   Collection Time: 07/08/23 11:38 AM  Result Value Ref Range   Lipase 32 11 - 51 U/L  Amylase     Status: None   Collection Time: 07/08/23 11:38 AM  Result Value Ref Range   Amylase 45 28 - 100 U/L  Magnesium     Status: None   Collection Time: 07/08/23 11:38 AM  Result Value Ref Range   Magnesium  2.1 1.7 - 2.4 mg/dL  Troponin I (High Sensitivity)     Status: None   Collection Time: 07/08/23 11:38 AM  Result Value Ref Range   Troponin I (High Sensitivity) 3 <18 ng/L  Glucose, capillary     Status: None   Collection Time: 07/08/23 11:54 AM  Result Value Ref Range   Glucose-Capillary 74 70 - 99 mg/dL  Type and screen     Status: None (Preliminary result)   Collection Time: 07/08/23 12:44 PM  Result Value Ref Range   ABO/RH(D) B NEG    Antibody Screen NEG    Sample Expiration 07/11/2023,2359    Unit Number [id number]    Blood Component Type RED CELLS,LR    Unit division 00    Status of Unit ALLOCATED    Transfusion Status OK TO TRANSFUSE    Crossmatch Result COMPATIBLE    Unit Number [id number]    Blood Component Type RED CELLS,LR    Unit division 00    Status of Unit  ALLOCATED    Transfusion Status OK TO TRANSFUSE    Crossmatch Result COMPATIBLE   Troponin I (High Sensitivity)     Status: None   Collection Time: 07/08/23 12:44 PM  Result Value Ref Range   Troponin I (High Sensitivity) 4 <18 ng/L  Ethanol     Status: None   Collection Time: 07/08/23 12:48 PM  Result Value Ref Range   Alcohol, Ethyl (B) <15 <15 mg/dL  Ammonia     Status: None   Collection Time: 07/08/23 12:48 PM  Result Value Ref Range   Ammonia 31 9 - 35 umol/L  Urinalysis, Routine w reflex microscopic -Urine, Catheterized     Status: Abnormal   Collection Time: 07/08/23  1:02 PM  Result Value Ref Range   Color, Urine YELLOW YELLOW   APPearance CLEAR CLEAR   Specific Gravity, Urine 1.008 1.005 - 1.030   pH 7.0 5.0 - 8.0   Glucose, UA 50 (A) NEGATIVE mg/dL   Hgb urine dipstick NEGATIVE NEGATIVE   Bilirubin Urine NEGATIVE NEGATIVE   Ketones, ur NEGATIVE NEGATIVE mg/dL   Protein, ur NEGATIVE NEGATIVE mg/dL   Nitrite NEGATIVE NEGATIVE   Leukocytes,Ua NEGATIVE NEGATIVE  Rapid urine drug screen (hospital performed)     Status: None   Collection Time: 07/08/23  1:02 PM  Result Value Ref Range   Opiates NONE DETECTED NONE DETECTED   Cocaine NONE DETECTED NONE DETECTED   Benzodiazepines NONE DETECTED NONE DETECTED   Amphetamines NONE DETECTED NONE DETECTED   Tetrahydrocannabinol NONE DETECTED NONE DETECTED   Barbiturates NONE DETECTED NONE DETECTED    Imaging:  CT Head Wo Contrast Result Date: 07/08/2023 CLINICAL DATA:  Syncope/presyncope, cerebrovascular cause suspected EXAM: CT HEAD WITHOUT CONTRAST TECHNIQUE: Contiguous axial images were obtained from the base of the skull through the vertex without intravenous contrast. RADIATION DOSE REDUCTION: This exam was performed according to the departmental dose-optimization program which includes automated exposure control, adjustment of the mA and/or kV according to patient size and/or use of iterative reconstruction technique.  COMPARISON:  CT of the head dated August 25, 2022. FINDINGS: Brain: Normal. No evidence of hemorrhage, mass, cortical infarct or hydrocephalus. Vascular: Negative. Skull: Intact and unremarkable. Sinuses/Orbits: Normal. Other: None. IMPRESSION: Normal brain. Electronically Signed   By: Maribeth Shivers M.D.   On: 07/08/2023 13:35   EKG: EKG: sinus tachycardia. This is a change from previous EKG from 2019 that showed NSR. I personally reviewed or consulted with a physician to help  with intrepretation.  MDM & MAU COURSE  MDM: High  MAU Course: -Pt minimally responsive upon arrival, sternal rub for response. -Started shaking shortly after starting IVF, discussed patient presentation and plan for workup with Dr. Willey Harrier. Called [facility] OB for story of office events. Patient does not have medical history to explain current symptoms. Rapid response nurse called for evaluation. -Blood pressures elevated during episode of shivering, normalized once shivering resolved. -EKG shows sinus tachycardia but no ACS or obvious arrhythmias. Troponin also within normal limits. -Tachycardia and severe range blood pressures. Labetalol ordered but blood pressure normalized. Continued tachycardia. - CMP negative for hyperglycemia, electrolyte abnormalities, renal or hepatic abnormalities.  Within normal limits for pregnancy. Amylase/lipase within normal limits. - CBC within normal limits for pregnancy. -Bedside US  shows full bladder. -On reassessment, GCS 15 and patient able to provide history of what she remembers. She does not remember anything after around 1000 when she was sitting in the waiting room after checkout. She now endorses a severe headache with photophobia and continued nausea. She has been wearing scopolamine  patches constantly for ~3 weeks but feels they have not been effective, nor has the Phenergan she has at home. She endorses headache for at least a week, not as severe as current headache but  unresponsive to Tylenol . Last dose of Tylenol  was last night. -Discussed with Dr. Fulton Job. Agrees with Zofran  for nausea, Excedrin-Tension for headache. Awaiting neurology consult. Dr. Fulton Job feels that symptoms are unlikely to be pregnancy related and admission to [facility] is not the most appropriate place if admission is necessary.  -Consult with neurology, Dr. Bonnita Buttner to evaluate. Per consult, no focality on exam and no red flags for headache. Suggests IV Benadryl  and IV fluids for headache and nausea. Long term, recommends discussing starting lamotrigine for mood stabilization since Depakote  was discontinued. -Patient noted that she has been changing her scopolamine  patches every 1-1.5 days instead of every 3 days as prescribed. Suspect that presentation may be anticholinergic-induced psychosis.  -On reassessment at 2200, patient minimally responsive during conversation. Discussed patient status with Dr. Fulton Job, admit to [facility] specialty for observation due to continued AMS. Dr. Fulton Job will put in admission orders and evaluate patient in the morning.   Orders Placed This Encounter  Procedures   CT Head Wo Contrast   CBC   Comprehensive metabolic panel   Lipase, blood   Amylase   Magnesium   Glucose, capillary   Urinalysis, Routine w reflex microscopic -Urine, Catheterized   Rapid urine drug screen (hospital performed)   Ethanol   Ammonia   Diet regular Room service appropriate? Yes; Fluid consistency: Thin   Notify physician (specify) Confirmatory reading of BP> 160/110 15 minutes later   Apply Hypertensive Disorders of Pregnancy Care Plan   Measure blood pressure   Consult to Neuro Hospitalist   ED EKG   Type and screen   Meds ordered this encounter  Medications   lactated ringers  bolus 1,000 mL   AND Linked Order Group    labetalol (NORMODYNE) injection 20 mg    labetalol (NORMODYNE) injection 40 mg    labetalol (NORMODYNE) injection 80 mg    hydrALAZINE (APRESOLINE) injection  10 mg   ondansetron  (ZOFRAN ) injection 4 mg   acetaminophen -caffeine (EXCEDRIN TENSION HEADACHE) 500-65 MG per tablet 2 tablet   diphenhydrAMINE  (BENADRYL ) injection 50 mg   lactated ringers  bolus 1,000 mL    ASSESSMENT   1. Transient alteration of awareness   2. Nausea and vomiting during pregnancy  3. Nonintractable headache, unspecified chronicity pattern, unspecified headache type     PLAN  Admit to [facility] specialty for observation due to continued AMS. Care transferred to Dr. Fulton Job and [facility] specialty.    Noreene Bearded, PA

## 2023-07-08 NOTE — MAU Note (Signed)
Pt transferred to [facility]SC via wheelchair. 

## 2023-07-08 NOTE — MAU Note (Addendum)
 Chelsea [name] is a 38 y.o. at [redacted]w[redacted]d here in MAU reporting: via EMS after passing out at the ob office, pt responsive to sternal rub but nothing else. Slurred speech noted when asked questions. Pt will not open her eyes. Caridad Chard PA notified. Husband at [facility] with some timeline information- drove herself to md appt, 0830, sent pictures to him at 0930, office called him about her passing out at 1020.   LMP: na Onset of complaint: this morning at MD office Pain score: pain in abd and head- obtained via small answers    FHT: na  Lab orders placed from triage: see Caridad Chard orders

## 2023-07-08 NOTE — MAU Note (Signed)
 Called lab to add on the triponin to tubes already in lab.

## 2023-07-09 ENCOUNTER — Encounter (HOSPITAL_COMMUNITY): Payer: Self-pay | Admitting: Obstetrics

## 2023-07-09 LAB — BPAM RBC
Blood Product Expiration Date: 202507062359
Blood Product Expiration Date: 202507102359
Unit Type and Rh: 1700
Unit Type and Rh: 1700

## 2023-07-09 LAB — TYPE AND SCREEN
ABO/RH(D): B NEG
Antibody Screen: NEGATIVE
Unit division: 0
Unit division: 0

## 2023-07-09 MED ORDER — ONDANSETRON 4 MG PO TBDP: 4.0000 mg | ORAL_TABLET | Freq: Three times a day (TID) | ORAL | 0 refills | Status: DC

## 2023-07-09 MED ORDER — FAMOTIDINE 20 MG PO TABS: 20.0000 mg | ORAL_TABLET | Freq: Two times a day (BID) | ORAL | 0 refills | Status: AC

## 2023-07-09 MED ORDER — METOCLOPRAMIDE HCL 10 MG PO TABS
10.0000 mg | ORAL_TABLET | Freq: Three times a day (TID) | ORAL | 1 refills | Status: DC | PRN
Start: 2023-07-09 — End: 2023-07-09

## 2023-07-09 MED ORDER — ONDANSETRON 4 MG PO TBDP: 4.0000 mg | ORAL_TABLET | Freq: Three times a day (TID) | ORAL | 1 refills | Status: DC

## 2023-07-09 MED ORDER — METOCLOPRAMIDE HCL 10 MG PO TABS: 10.0000 mg | ORAL_TABLET | Freq: Three times a day (TID) | ORAL | 0 refills | Status: DC | PRN

## 2023-07-09 NOTE — Discharge Instructions (Signed)
 Call office with any concerns [phone]

## 2023-07-09 NOTE — Discharge Summary (Signed)
 Physician Discharge Summary  Patient ID: Chelsea [name] MRN: [id number] DOB/AGE: Dec 12, 1985 38 y.o.  Admit date: 07/08/2023 Discharge date: 07/09/2023  Admission Diagnoses: 1) [redacted] weeks gestation 2) Altered mental status 3) Syncope in pregnancy  4) Nausea and vomiting of pregnancy  5) Headache 6) Mental disorders in pregnancy   Discharge Diagnoses: 1) [redacted] weeks gestation 2) Altered mental status, resolved 3) Syncope in pregnancy  4) Nausea and vomiting of pregnancy, improved 5) Headache, resolved 6) Mental disorders in pregnancy  Principal Problem:   Nausea and vomiting of pregnancy, antepartum   Discharged Condition: stable  Hospital Course: Admitted for AMS and headache following syncopal episode. Labs and head CT negative for acute pathology. Neurology consulted, etiology believed to be related to dehydration and acute exacerbation of mental health. Following administration of recommended IV benadryl , she was lethargic and required assistance with ambulation. She was observed overnight, where she had no additional episodes of altered mental status. Her nausea was controlled without need for antiemetics.   Consults: neurology  Significant Diagnostic Studies: labs: troponin, UDS/tox, UA, CBC/CMP unremarkable and radiology: CT scan: head unremarkable  Discharge Exam: Blood pressure 118/74, pulse 77, temperature 98.5 F (36.9 C), temperature source Oral, resp. rate 17, SpO2 100%, unknown if currently breastfeeding. GEN :  NAD, awake and alert ABD: soft, mild TTD LLQ EXT: : No edema, non-tender  Disposition: Discharge disposition: 01-Home or Self Care       Discharge Instructions     Discharge activity:  No Restrictions   Complete by: As directed    Discharge diet:  No restrictions   Complete by: As directed    Discharge instructions   Complete by: As directed    Call office with any concerns [phone]      Allergies as of 07/09/2023       Reactions   Hydromet  [hydrocodone  Bit-homatrop Mbr] Anaphylaxis, Itching, Swelling   Patient states that her throat closes   Morphine  And Codeine Itching, Other (See Comments)   Makes entire burn and itch SEVERELY; clawed her skin   Penicillins Palpitations   Has patient had a PCN reaction causing immediate rash, facial/tongue/throat swelling, SOB or lightheadedness with hypotension: Yes Has patient had a PCN reaction causing severe rash involving mucus membranes or skin necrosis: No Has patient had a PCN reaction that required hospitalization: No Has patient had a PCN reaction occurring within the last 10 years: Yes If all of the above answers are NO, then may proceed with Cephalosporin use.        Medication List     STOP taking these medications    promethazine 12.5 MG tablet Commonly known as: PHENERGAN   scopolamine  1 MG/3DAYS Commonly known as: TRANSDERM-SCOP       TAKE these medications    acetaminophen  500 MG tablet Commonly known as: TYLENOL  Take 500-1,000 mg by mouth every 6 (six) hours as needed (for pain or headaches).   albuterol  108 (90 Base) MCG/ACT inhaler Commonly known as: VENTOLIN  HFA Inhale 1-2 puffs into the lungs every 6 (six) hours as needed for wheezing or shortness of breath.   famotidine  20 MG tablet Commonly known as: PEPCID  TAKE 1 TABLET BY MOUTH 2 TIMES A DAY   LORazepam  0.5 MG tablet Commonly known as: ATIVAN  Take 1 tablet (0.5 mg total) by mouth as needed.   metoCLOPramide  10 MG tablet Commonly known as: REGLAN  Take 1 tablet (10 mg total) by mouth every 8 (eight) hours as needed for refractory nausea / vomiting (  nausea / vomiting refractory to zofran ).   ondansetron  4 MG disintegrating tablet Commonly known as: ZOFRAN -ODT Take 1 tablet (4 mg total) by mouth every 8 (eight) hours.   Prenatal Adult Gummy/DHA/FA 0.4-25 MG Chew Chew 1 tablet by mouth.   QUEtiapine  300 MG tablet Commonly known as: SEROQUEL  Take 1 tablet (300 mg total) by mouth at  bedtime.        Follow-up Information     Ob/Gyn, Tita Form. Schedule an appointment as soon as possible for a visit in 2 week(s).   Contact information: [address] [place] [address] [phone]                 Signed: TASSIE POLLETT 07/09/2023, 9:39 AM

## 2023-07-09 NOTE — Plan of Care (Signed)
  Problem: Education: Goal: Knowledge of disease or condition will improve Outcome: Progressing Goal: Knowledge of the prescribed therapeutic regimen will improve Outcome: Progressing   Problem: Fluid Volume: Goal: Peripheral tissue perfusion will improve Outcome: Progressing   Problem: Clinical Measurements: Goal: Complications related to disease process, condition or treatment will be avoided or minimized Outcome: Progressing   Problem: Education: Goal: Knowledge of disease or condition will improve Outcome: Progressing Goal: Knowledge of the prescribed therapeutic regimen will improve Outcome: Progressing

## 2023-07-09 NOTE — H&P (Signed)
 38 y.o. [id number] @ [redacted]w[redacted]d presents with via EMS from the office.  She drove herself to the office today for a routine visit, then while awaiting check out started to feel dizzy and unwell.  She did not fall or hit her head, but reportedly had intermittent decreased consciousness requiring a sternal rub.  EMS was called for transport.  BG was in the 90s.  In MAU, she remained somewhat disoriented and had a full work up. Head CT was negative.  Labs were negative.  EKG with sinus tachycardia.  She had episodes of frequent shaking or shivering in MAU--the provider reports multiple erroneous elevated BPs / HR related to this.  Neurology was consulted and found exam to be non-focal.  Felt symptoms were related to anxiety and dehydration.  They recommended  benadryl  to treat her reported headache, so she received 50 mg IV benadryl .  Following administration, MAU reported that she was shaky and required assistance to ambulate to the bathroom.  She received her evening dose of seroquel  and rested comfortably overnight.  RN reported she did not require any additional medications for nausea.  Was able to ambulate well to the bathroom.  Has been sleepy, but appropriate  Upon arrival to her room this AM, she was sleeping comfortably. She was easily aroused to voice and appropriately answered questions.  Just reported she was sleepy.   Pregnancy complicated by: Bipolar disorder and anxiety.  On seroquel ; depakote  stopped 1 mo ago with +UPT.  At initial OB visit on 5/12 reports she had been zoning out and feeling foggy Recent visit with her mental health provider who discussed starting lamotrigine but change was not made.   Nausea and vomiting of pregnancy: 5 lb weight loss over one month.  Has been using a scopolamine  patch that she has been changing every 1-1.5 days as well as phenergan.   Past Medical History:  Diagnosis Date   Anxiety    Asthma    Atypical chest pain    Back pain    Bipolar affective  disorder (HCC)    Complication of anesthesia    woke up during surgery   Depression    Hyperlipidemia    UTI (urinary tract infection)     Past Surgical History:  Procedure Laterality Date   NASAL SEPTUM SURGERY  10/2022   TONSILLECTOMY      OB History  Gravida Para Term Preterm AB Living  3 2 1 1  2   SAB IAB Ectopic Multiple Live Births     0 2    # Outcome Date GA Lbr Len/2nd Weight Sex Type Anes PTL Lv  3 Current           2 Term 09/27/16 [redacted]w[redacted]d 04:04 / 00:22 2900 g F Vag-Spont EPI  LIV  1 Preterm 05/20/10   2268 g F  EPI Y LIV    Obstetric Comments  2012 PPROM    Social History   Socioeconomic History   Marital status: Married    Spouse name: Not on file   Number of children: Not on file   Years of education: Not on file   Highest education level: Not on file  Occupational History   Not on file  Tobacco Use   Smoking status: Never   Smokeless tobacco: Never  Vaping Use   Vaping status: Never Used  Substance and Sexual Activity   Alcohol use: Not Currently   Drug use: No   Sexual activity: Yes  Other Topics Concern  Not on file  Social History Narrative   Not on file   Social Drivers of Health   Financial Resource Strain: Not on file  Food Insecurity: No Food Insecurity (07/09/2023)   Hunger Vital Sign    Worried About Running Out of Food in the Last Year: Never true    Ran Out of Food in the Last Year: Never true  Transportation Needs: No Transportation Needs (07/09/2023)   PRAPARE - Administrator, Civil Service (Medical): No    Lack of Transportation (Non-Medical): No  Physical Activity: Not on file  Stress: Not on file  Social Connections: Not on file  Intimate Partner Violence: Not on file   Hydromet [hydrocodone  bit-homatrop mbr], Morphine  and codeine, and Penicillins     Vitals:   07/08/23 2231 07/09/23 0007  BP: (!) 126/90 113/61  Pulse: 71 87  Resp:  15  Temp:  98 F (36.7 C)  SpO2:  98%     Exam deferred--sleeping  comfortably in bed    A/P   38 y.o. [id number] [redacted]w[redacted]d presents following syncopal episode at the office in the setting of nausea and vomiting of pregnancy Syncope: s/p neurology consultation; head CT and labs negative.  Per neuro, likely related to anxiety and depression.  Received 2L IVF in MAU and additional maintenance fluids overnight.  Suspect some of her difficulty ambulating last night was related to a large dose of IV benadryl .  Also was using scopolamine  patch q1-1.5 days--this has since been discontinued. Will re-evaluate later this morning when she wakes up Nausea and vomiting: scopolamine  patch stopped.  Did not feel that phenergan worked well. Would not add diclegis as seroquel  is already sedating.  Consider discharge on scheduled zofran  + reglan  prn Headache: improved with tylenol .  Suspect related in large part to poor PO intake and dehydration from nausea / vomiting Bipolar disorder and anxiety: continue home seroquel .  Neurology recommended f/u w her mental health provider to readdress lamotrigine    [facility] GEFFEL Fulton Job

## 2023-07-09 NOTE — Progress Notes (Signed)
 Patient ID: Chelsea [name], female   DOB: Dec 08, 1985, 38 y.o.   MRN: [id number]  38 yo G3P1102 @ 9.1 HD#2 admitted for AMS following suspected syncope in office yesterday   S: Has not any nausea of vomiting while admitted. Feeling better after sleeping some more this morning. Occasional LLQ cramping, present x 2 days. Denies bleeding or change in discharge  Vitals:   07/09/23 0400 07/09/23 0500 07/09/23 0559 07/09/23 0850  BP:   113/68 118/74  Pulse:   73 77  Resp:   17 17  Temp:   98 F (36.7 C) 98.5 F (36.9 C)  TempSrc:   Oral Oral  SpO2: 98% 99% 98% 100%   GEN :  NAD, awake and alert ABD: soft, mild TTD LLQ EXT: : No edema, non-tender  ASSESSMENT AND PLAN:  38 y.o. [id number] [redacted]w[redacted]d HD#2 admitted following syncopal episode at the office in the setting of nausea and vomiting of pregnancy Syncope: s/p neurology consultation; head CT and labs negative.  Per neuro, likely related to anxiety and depression.  Received 2L IVF in MAU and additional maintenance fluids overnight.  Suspect some of her difficulty ambulating last night was related to a large dose of IV benadryl .  Also was using scopolamine  patch q1-1.5 days--this has since been discontinued. Feeling better since getting more sleep overnight, wanting to go home Nausea and vomiting: scopolamine  patch stopped.  Did not feel that phenergan worked well. Would not add diclegis as seroquel  is already sedating. Questions answered from partner re: safety of Zofran . Discussed given GA, B>R. Agree with discharge on scheduled zofran  + reglan  prn Headache: Resolved. Suspect related in large part to poor PO intake and dehydration from nausea / vomiting Bipolar disorder and anxiety: continue home seroquel .  Neurology recommended f/u w her mental health provider to readdress lamotrigine Dispo: DC home today. Will have outpatient f/u in 1-2 weeks with OB/GYN

## 2023-07-12 ENCOUNTER — Ambulatory Visit: Payer: Self-pay | Admitting: Family Medicine

## 2023-07-15 LAB — OB RESULTS CONSOLE HIV ANTIBODY (ROUTINE TESTING): HIV: NONREACTIVE

## 2023-07-15 LAB — OB RESULTS CONSOLE RUBELLA ANTIBODY, IGM: Rubella: IMMUNE

## 2023-07-15 LAB — OB RESULTS CONSOLE HEPATITIS B SURFACE ANTIGEN: Hepatitis B Surface Ag: NEGATIVE

## 2023-07-16 ENCOUNTER — Non-Acute Institutional Stay (HOSPITAL_COMMUNITY)
Admission: RE | Admit: 2023-07-16 | Discharge: 2023-07-16 | Disposition: A | Discharge status: Home / Self Care | Source: Ambulatory Visit | Attending: Internal Medicine | Admitting: Internal Medicine

## 2023-07-16 DIAGNOSIS — Z3A00 Weeks of gestation of pregnancy not specified: Secondary | ICD-10-CM | POA: Insufficient documentation

## 2023-07-16 DIAGNOSIS — O219 Vomiting of pregnancy, unspecified: Secondary | ICD-10-CM | POA: Insufficient documentation

## 2023-07-16 MED ORDER — DEXTROSE IN LACTATED RINGERS 5 % IV SOLN: Freq: Once | INTRAVENOUS | Status: AC

## 2023-07-16 NOTE — Progress Notes (Signed)
 PATIENT CARE CENTER NOTE  Diagnosis: ICD-10:021.9: Vomiting in pregnancy, unspecified  Provider: Alene Ana, MD   Procedure: IV fluid bolus (#1 of 4)  Note: Patient received 1,000 mL D5% Lactated ringers  IV fluid bolus infused over 1 hour via PIV. Pt tolerated infusion with no adverse reaction. Vital signs are stable. Pt advised that per orders she should come 2 x a week for a total of 2 weeks, and that she can schedule her next appointment at the front desk. AVS printed and given to pt. Pt is alert, oriented, and ambulatory at discharge.

## 2023-07-19 ENCOUNTER — Non-Acute Institutional Stay (HOSPITAL_COMMUNITY)
Admission: RE | Admit: 2023-07-19 | Discharge: 2023-07-19 | Disposition: A | Discharge status: Home / Self Care | Source: Ambulatory Visit | Attending: Internal Medicine | Admitting: Internal Medicine

## 2023-07-19 DIAGNOSIS — O219 Vomiting of pregnancy, unspecified: Secondary | ICD-10-CM | POA: Insufficient documentation

## 2023-07-19 MED ORDER — DEXTROSE 5 % IN LACTATED RINGERS IV BOLUS
1000.0000 mL | Freq: Once | INTRAVENOUS | Status: AC
  Administered 2023-07-19: 1000 mL via INTRAVENOUS

## 2023-07-19 NOTE — Progress Notes (Addendum)
 PATIENT CARE CENTER NOTE  Diagnosis: ICD-10:021.9: Vomiting in pregnancy, unspecified    Provider:  Claire Rubie LABOR, MD   Procedure:  IV fluid bolus (#2 of 4)   Note: Patient received 1,000 mL D5% Lactated Ringers  IV fluid bolus infused over 1 hour via PIV. Pt tolerated infusion with no adverse reaction. Vital signs are stable. AVS offered, but pt declined. Per orders pt should come for infusion 2 x a week for a total of 2 weeks. Pts next appointment already scheduled for Wednesday, June 25th at 930 AM. Pt is alert, oriented, and ambulatory at discharge.

## 2023-07-21 ENCOUNTER — Non-Acute Institutional Stay (HOSPITAL_COMMUNITY)
Admission: RE | Admit: 2023-07-21 | Discharge: 2023-07-21 | Disposition: A | Discharge status: Home / Self Care | Source: Ambulatory Visit | Attending: Internal Medicine | Admitting: Internal Medicine

## 2023-07-21 DIAGNOSIS — O219 Vomiting of pregnancy, unspecified: Secondary | ICD-10-CM | POA: Insufficient documentation

## 2023-07-21 MED ORDER — DEXTROSE 5 % IN LACTATED RINGERS IV BOLUS
1000.0000 mL | Freq: Once | INTRAVENOUS | Status: AC
  Administered 2023-07-21: 1000 mL via INTRAVENOUS

## 2023-07-21 NOTE — Progress Notes (Addendum)
 PATIENT CARE CENTER NOTE  Diagnosis: ICD-10:021.9: Vomiting in pregnancy, unspecified    Provider: Claire Rubie LABOR, MD  Procedure:  IV fluid bolus (#3 of 4)     Note: Patient received 1,000 mL D5% Lactated Ringers  bolus infused over 1 hour via PIV. Pt tolerated the infusion with no adverse reaction. Vital signs are stable. AVS offered, but pt declined. Pt is alert, oriented, and ambulatory at discharge. Pts next infusion already scheduled for Wednesday, July 2nd at 930 AM.

## 2023-07-28 ENCOUNTER — Ambulatory Visit (HOSPITAL_COMMUNITY)
Admission: RE | Admit: 2023-07-28 | Discharge: 2023-07-28 | Disposition: A | Discharge status: Home / Self Care | Source: Ambulatory Visit | Attending: Internal Medicine | Admitting: Internal Medicine

## 2023-07-28 DIAGNOSIS — O219 Vomiting of pregnancy, unspecified: Secondary | ICD-10-CM | POA: Insufficient documentation

## 2023-07-28 MED ORDER — DEXTROSE IN LACTATED RINGERS 5 % IV SOLN: Freq: Once | INTRAVENOUS | Status: AC

## 2023-07-28 NOTE — Progress Notes (Signed)
 PATIENT CARE CENTER NOTE  Diagnosis: ICD-10:021.9: Vomiting in pregnancy, unspecified    Provider: Claire Rubie LABOR, MD   Procedure: IV fluid Bolus (#4 of 4)  Note: Patient received 1,000 mL D5% Lactated Ringers  bolus infused over 1 hour via PIV. Pt tolerated the infusion with no adverse reaction. Vital signs are stable. AVS offered, but pt declined. Pt is alert, oriented, and ambulatory at discharge.

## 2023-07-29 ENCOUNTER — Other Ambulatory Visit (HOSPITAL_COMMUNITY): Payer: Self-pay

## 2023-10-04 ENCOUNTER — Encounter: Payer: Self-pay | Admitting: *Deleted

## 2023-10-05 ENCOUNTER — Ambulatory Visit: Attending: Cardiology | Admitting: Cardiology

## 2023-10-05 ENCOUNTER — Encounter: Payer: Self-pay | Admitting: Cardiology

## 2023-10-05 ENCOUNTER — Ambulatory Visit (INDEPENDENT_AMBULATORY_CARE_PROVIDER_SITE_OTHER)

## 2023-10-05 VITALS — BP 126/78 | HR 101 | Ht 63.0 in | Wt 185.0 lb

## 2023-10-05 DIAGNOSIS — Z3A21 21 weeks gestation of pregnancy: Secondary | ICD-10-CM | POA: Diagnosis not present

## 2023-10-05 DIAGNOSIS — R002 Palpitations: Secondary | ICD-10-CM

## 2023-10-05 NOTE — Progress Notes (Unsigned)
 Enrolled patient for a 14 day Zio XT  monitor to be mailed to patients home

## 2023-10-05 NOTE — Patient Instructions (Signed)
 Medication Instructions:  Your physician recommends that you continue on your current medications as directed. Please refer to the Current Medication list given to you today.  *If you need a refill on your cardiac medications before your next appointment, please call your pharmacy*  Lab Work: CMET, Mag If you have labs (blood work) drawn today and your tests are completely normal, you will receive your results only by: MyChart Message (if you have MyChart) OR A paper copy in the mail If you have any lab test that is abnormal or we need to change your treatment, we will call you to review the results.  Testing/Procedures: GEOFFRY HEWS- Long Term Monitor Instructions  Your physician has requested you wear a ZIO patch monitor for 14 days.  This is a single patch monitor. [employer] supplies one patch monitor per enrollment. Additional stickers are not available. Please do not apply patch if you will be having a Nuclear Stress Test,  Echocardiogram, Cardiac CT, MRI, or Chest Xray during the period you would be wearing the  monitor. The patch cannot be worn during these tests. You cannot remove and re-apply the  ZIO XT patch monitor.  Your ZIO patch monitor will be mailed 3 day USPS to your address on file. It may take 3-5 days  to receive your monitor after you have been enrolled.  Once you have received your monitor, please review the enclosed instructions. Your monitor  has already been registered assigning a specific monitor serial # to you.  Billing and Patient Assistance Program Information  We have supplied [employer] with any of your insurance information on file for billing purposes. [employer] offers a sliding scale Patient Assistance Program for patients that do not have  insurance, or whose insurance does not completely cover the cost of the ZIO monitor.  You must apply for the Patient Assistance Program to qualify for this discounted rate.  To apply, please call [employer] at [phone],  select option 4, select option 2, ask to apply for  Patient Assistance Program. Meredeth will ask your household income, and how many people  are in your household. They will quote your out-of-pocket cost based on that information.  [employer] will also be able to set up a 11-month, interest-free payment plan if needed.  Applying the monitor   Shave hair from upper left chest.  Hold abrader disc by orange tab. Rub abrader in 40 strokes over the upper left chest as  indicated in your monitor instructions.  Clean area with 4 enclosed alcohol pads. Let dry.  Apply patch as indicated in monitor instructions. Patch will be placed under collarbone on left  side of chest with arrow pointing upward.  Rub patch adhesive wings for 2 minutes. Remove white label marked 1. Remove the white  label marked 2. Rub patch adhesive wings for 2 additional minutes.  While looking in a mirror, press and release button in center of patch. A small green light will  flash 3-4 times. This will be your only indicator that the monitor has been turned on.  Do not shower for the first 24 hours. You may shower after the first 24 hours.  Press the button if you feel a symptom. You will hear a small click. Record Date, Time and  Symptom in the Patient Logbook.  When you are ready to remove the patch, follow instructions on the last 2 pages of Patient  Logbook. Stick patch monitor onto the last page of Patient Logbook.  Place Patient Logbook in the  blue and white box. Use locking tab on box and tape box closed  securely. The blue and white box has prepaid postage on it. Please place it in the mailbox as  soon as possible. Your physician should have your test results approximately 7 days after the  monitor has been mailed back to [facility].  Call [facility] Customer Care at [phone] if you have questions regarding  your ZIO XT patch monitor. Call them immediately if you see an orange light blinking on your   monitor.  If your monitor falls off in less than 4 days, contact our Monitor department at [phone].  If your monitor becomes loose or falls off after 4 days call [employer] at [phone] for  suggestions on securing your monitor   Follow-Up: At [facility], you and your health needs are our priority.  As part of our continuing mission to provide you with exceptional heart care, our providers are all part of one team.  This team includes your primary Cardiologist (physician) and Advanced Practice Providers or APPs (Physician Assistants and Nurse Practitioners) who all work together to provide you with the care you need, when you need it.  Your next appointment:   10 week(s) via MyChart  Provider:   Kardie Tobb, DO

## 2023-10-05 NOTE — Progress Notes (Unsigned)
 Cardio-Obstetrics Clinic  {Choose New Eval or Follow Up Note:[phone]}   Prior CV Studies Reviewed: The following studies were reviewed today: ***  Past Medical History:  Diagnosis Date   Anxiety    Asthma    Atypical chest pain    Back pain    Bipolar affective disorder (HCC)    Complication of anesthesia    woke up during surgery   Depression    Hyperlipidemia    UTI (urinary tract infection)     Past Surgical History:  Procedure Laterality Date   NASAL SEPTUM SURGERY  10/2022   TONSILLECTOMY     { Click here to update PMH, PSH, OB Hx then refresh note  :1}   OB History     Gravida  3   Para  2   Term  1   Preterm  1   AB      Living  2      SAB      IAB      Ectopic      Multiple  0   Live Births  2        Obstetric Comments  2012 PPROM         { Click here to update OB Charting then refresh note  :1}    Current Medications: Current Meds  Medication Sig   acetaminophen  (TYLENOL ) 500 MG tablet Take 500-1,000 mg by mouth every 6 (six) hours as needed (for pain or headaches).   albuterol  (PROVENTIL  HFA;VENTOLIN  HFA) 108 (90 Base) MCG/ACT inhaler Inhale 1-2 puffs into the lungs every 6 (six) hours as needed for wheezing or shortness of breath.   famotidine  (PEPCID ) 20 MG tablet Take 1 tablet (20 mg total) by mouth 2 (two) times daily.   LORazepam  (ATIVAN ) 0.5 MG tablet Take 1 tablet (0.5 mg total) by mouth as needed.   metoCLOPramide  (REGLAN ) 10 MG tablet Take 1 tablet (10 mg total) by mouth every 8 (eight) hours as needed for refractory nausea / vomiting (nausea / vomiting refractory to zofran ).   ondansetron  (ZOFRAN -ODT) 4 MG disintegrating tablet Take 1 tablet (4 mg total) by mouth every 8 (eight) hours.   Prenatal MV & Min w/FA-DHA (PRENATAL ADULT GUMMY/DHA/FA) 0.4-25 MG CHEW Chew 1 tablet by mouth.   QUEtiapine  (SEROQUEL ) 300 MG tablet Take 1 tablet (300 mg total) by mouth at bedtime.     Allergies:   Hydromet [hydrocodone   bit-homatrop mbr], Morphine  and codeine, and Penicillins   Social History   Socioeconomic History   Marital status: Married    Spouse name: Not on file   Number of children: Not on file   Years of education: Not on file   Highest education level: Not on file  Occupational History   Not on file  Tobacco Use   Smoking status: Never   Smokeless tobacco: Never  Vaping Use   Vaping status: Never Used  Substance and Sexual Activity   Alcohol use: Not Currently   Drug use: No   Sexual activity: Yes  Other Topics Concern   Not on file  Social History Narrative   Not on file   Social Drivers of Health   Financial Resource Strain: Not on file  Food Insecurity: No Food Insecurity (07/09/2023)   Hunger Vital Sign    Worried About Running Out of Food in the Last Year: Never true    Ran Out of Food in the Last Year: Never true  Transportation Needs: No Transportation Needs (07/09/2023)  PRAPARE - Administrator, Civil Service (Medical): No    Lack of Transportation (Non-Medical): No  Physical Activity: Not on file  Stress: Not on file  Social Connections: Not on file  { Click here to update SDOH then refresh :1}    Family History  Problem Relation Age of Onset   Hypertension Mother    Hypertension Father    High Cholesterol Father    Heart block Father        partial   { Click here to update FH then refresh note    :1}   ROS:   Please see the history of present illness.    *** All other systems reviewed and are negative.   Labs/EKG Reviewed:    EKG:   EKG is *** ordered today.  The ekg ordered today demonstrates ***  Recent Labs: 07/08/2023: ALT 10; BUN 7; Creatinine, Ser 0.77; Hemoglobin 13.8; Magnesium 2.1; Platelets 168; Potassium 4.0; Sodium 136   Recent Lipid Panel No results found for: CHOL, TRIG, HDL, CHOLHDL, LDLCALC, LDLDIRECT  Physical Exam:    VS:  BP 126/78 (BP Location: Right Arm, Patient Position: Sitting, Cuff Size:  Normal)   Pulse (!) 101   Ht 5' 3 (1.6 m)   Wt 185 lb (83.9 kg)   LMP  (LMP Unknown) Comment: irreg cycles, wasn't tracking  SpO2 97%   BMI 32.77 kg/m     Wt Readings from Last 3 Encounters:  10/05/23 185 lb (83.9 kg)  06/18/23 181 lb 9.6 oz (82.4 kg)  08/19/17 166 lb 12.8 oz (75.7 kg)     GEN: *** Well nourished, well developed in no acute distress HEENT: Normal NECK: No JVD; No carotid bruits LYMPHATICS: No lymphadenopathy CARDIAC: ***RRR, no murmurs, rubs, gallops RESPIRATORY:  Clear to auscultation without rales, wheezing or rhonchi  ABDOMEN: Soft, non-tender, non-distended MUSCULOSKELETAL:  No edema; No deformity  SKIN: Warm and dry NEUROLOGIC:  Alert and oriented x 3 PSYCHIATRIC:  Normal affect    Risk Assessment/Risk Calculators:   { Click to calculate CARPREG II - THEN refresh note :1}    { Click to caclulate Mod WHO Class of CV Risk - THEN refresh note :1}     { Click for CHADS2VASc Score - THEN Refresh Note    :[id number]}      ASSESSMENT & PLAN:    *** Patient Instructions  Medication Instructions:  Your physician recommends that you continue on your current medications as directed. Please refer to the Current Medication list given to you today.  *If you need a refill on your cardiac medications before your next appointment, please call your pharmacy*  Lab Work: CMET, Mag If you have labs (blood work) drawn today and your tests are completely normal, you will receive your results only by: MyChart Message (if you have MyChart) OR A paper copy in the mail If you have any lab test that is abnormal or we need to change your treatment, we will call you to review the results.  Testing/Procedures: GEOFFRY HEWS- Long Term Monitor Instructions  Your physician has requested you wear a ZIO patch monitor for 14 days.  This is a single patch monitor. [employer] supplies one patch monitor per enrollment. Additional stickers are not available. Please do not apply patch  if you will be having a Nuclear Stress Test,  Echocardiogram, Cardiac CT, MRI, or Chest Xray during the period you would be wearing the  monitor. The patch cannot be worn during these tests. You  cannot remove and re-apply the  ZIO XT patch monitor.  Your ZIO patch monitor will be mailed 3 day USPS to your address on file. It may take 3-5 days  to receive your monitor after you have been enrolled.  Once you have received your monitor, please review the enclosed instructions. Your monitor  has already been registered assigning a specific monitor serial # to you.  Billing and Patient Assistance Program Information  We have supplied [employer] with any of your insurance information on file for billing purposes. [employer] offers a sliding scale Patient Assistance Program for patients that do not have  insurance, or whose insurance does not completely cover the cost of the ZIO monitor.  You must apply for the Patient Assistance Program to qualify for this discounted rate.  To apply, please call [employer] at [phone], select option 4, select option 2, ask to apply for  Patient Assistance Program. Meredeth will ask your household income, and how many people  are in your household. They will quote your out-of-pocket cost based on that information.  [employer] will also be able to set up a 107-month, interest-free payment plan if needed.  Applying the monitor   Shave hair from upper left chest.  Hold abrader disc by orange tab. Rub abrader in 40 strokes over the upper left chest as  indicated in your monitor instructions.  Clean area with 4 enclosed alcohol pads. Let dry.  Apply patch as indicated in monitor instructions. Patch will be placed under collarbone on left  side of chest with arrow pointing upward.  Rub patch adhesive wings for 2 minutes. Remove white label marked 1. Remove the white  label marked 2. Rub patch adhesive wings for 2 additional minutes.  While looking in a mirror, press and  release button in center of patch. A small green light will  flash 3-4 times. This will be your only indicator that the monitor has been turned on.  Do not shower for the first 24 hours. You may shower after the first 24 hours.  Press the button if you feel a symptom. You will hear a small click. Record Date, Time and  Symptom in the Patient Logbook.  When you are ready to remove the patch, follow instructions on the last 2 pages of Patient  Logbook. Stick patch monitor onto the last page of Patient Logbook.  Place Patient Logbook in the blue and white box. Use locking tab on box and tape box closed  securely. The blue and white box has prepaid postage on it. Please place it in the mailbox as  soon as possible. Your physician should have your test results approximately 7 days after the  monitor has been mailed back to [facility].  Call [facility] Customer Care at [phone] if you have questions regarding  your ZIO XT patch monitor. Call them immediately if you see an orange light blinking on your  monitor.  If your monitor falls off in less than 4 days, contact our Monitor department at [phone].  If your monitor becomes loose or falls off after 4 days call [employer] at [phone] for  suggestions on securing your monitor   Follow-Up: At [facility], you and your health needs are our priority.  As part of our continuing mission to provide you with exceptional heart care, our providers are all part of one team.  This team includes your primary Cardiologist (physician) and Advanced Practice Providers or APPs (Physician Assistants and Nurse Practitioners) who all work together  to provide you with the care you need, when you need it.  Your next appointment:   10 week(s) via MyChart  Provider:   Michayla Mcneil, DO     Dispo:  No follow-ups on file.   Medication Adjustments/Labs and Tests Ordered: Current medicines are reviewed at length with the patient today.   Concerns regarding medicines are outlined above.  Tests Ordered: No orders of the defined types were placed in this encounter.  Medication Changes: No orders of the defined types were placed in this encounter.

## 2023-10-06 LAB — COMPREHENSIVE METABOLIC PANEL WITH GFR
ALT: 8 IU/L (ref 0–32)
AST: 14 IU/L (ref 0–40)
Albumin: 3.7 g/dL — ABNORMAL LOW (ref 3.9–4.9)
Alkaline Phosphatase: 73 IU/L (ref 44–121)
BUN/Creatinine Ratio: 7 — ABNORMAL LOW (ref 9–23)
BUN: 4 mg/dL — ABNORMAL LOW (ref 6–20)
Bilirubin Total: 0.2 mg/dL (ref 0.0–1.2)
CO2: 16 mmol/L — ABNORMAL LOW (ref 20–29)
Calcium: 9.1 mg/dL (ref 8.7–10.2)
Chloride: 105 mmol/L (ref 96–106)
Creatinine, Ser: 0.54 mg/dL — ABNORMAL LOW (ref 0.57–1.00)
Globulin, Total: 2 g/dL (ref 1.5–4.5)
Glucose: 72 mg/dL (ref 70–99)
Potassium: 3.8 mmol/L (ref 3.5–5.2)
Sodium: 135 mmol/L (ref 134–144)
Total Protein: 5.7 g/dL — ABNORMAL LOW (ref 6.0–8.5)
eGFR: 122 mL/min/1.73 (ref >59)

## 2023-10-06 LAB — MAGNESIUM: Magnesium: 2 mg/dL (ref 1.6–2.3)

## 2023-10-15 ENCOUNTER — Ambulatory Visit: Payer: Self-pay | Admitting: Cardiology

## 2023-10-27 NOTE — Telephone Encounter (Signed)
 Results/message from Dr. Emmette Harms has been released to MyChart. A letter is being sent to the last known home address.

## 2023-11-06 ENCOUNTER — Ambulatory Visit (HOSPITAL_BASED_OUTPATIENT_CLINIC_OR_DEPARTMENT_OTHER)
Admission: EM | Admit: 2023-11-06 | Discharge: 2023-11-06 | Disposition: A | Discharge status: Home / Self Care | Attending: Family Medicine | Admitting: Family Medicine

## 2023-11-06 ENCOUNTER — Encounter (HOSPITAL_BASED_OUTPATIENT_CLINIC_OR_DEPARTMENT_OTHER): Payer: Self-pay

## 2023-11-06 DIAGNOSIS — J029 Acute pharyngitis, unspecified: Secondary | ICD-10-CM

## 2023-11-06 LAB — POC COVID19/FLU A&B COMBO
Covid Antigen, POC: NEGATIVE
Influenza A Antigen, POC: NEGATIVE
Influenza B Antigen, POC: NEGATIVE

## 2023-11-06 LAB — POCT RAPID STREP A (OFFICE): Rapid Strep A Screen: NEGATIVE

## 2023-11-06 NOTE — Discharge Instructions (Signed)
 Your covid, flu and strep test are negative. This is most likely a viral illness.  You can take tylenol  for pain.  Mucinex DM or Robitussin DM for cough.  Zyrtec for post nasal drip and congestion.

## 2023-11-06 NOTE — ED Provider Notes (Signed)
 [facility]    CSN: [id number] Arrival date & time: 11/06/23  1339      History   Chief Complaint Chief Complaint  Patient presents with   Cough   Generalized Body Aches   Fever    HPI Chelsea [name] is a 38 y.o. female.   Pt is a 38 year old female that presents with cough. Patient seen by pcp yesterday for cough, body aches,  and onset of sore throat. Seen at a different urgent care 1 week ago and was prescribed amoxicillin. Has finished antibiotics with no improvement. States fever as well. Desires covid/flu testing and strep.    Cough Associated symptoms: fever   Fever Associated symptoms: cough     Past Medical History:  Diagnosis Date   Anxiety    Asthma    Atypical chest pain    Back pain    Bipolar affective disorder (HCC)    Complication of anesthesia    woke up during surgery   Depression    Hyperlipidemia    UTI (urinary tract infection)     Patient Active Problem List   Diagnosis Date Noted   Bipolar disorder (HCC) 07/08/2023   Current mild episode of major depressive disorder without prior episode 07/08/2023   Hypercholesterolemia 07/08/2023   Intramural leiomyoma of uterus 07/08/2023   Nausea and vomiting of pregnancy, antepartum 07/08/2023   Mild intermittent asthma without complication 07/27/2020   Anxiety 08/19/2017   Atypical chest pain    History of preterm delivery 04/08/2016    Past Surgical History:  Procedure Laterality Date   NASAL SEPTUM SURGERY  10/2022   TONSILLECTOMY      OB History     Gravida  3   Para  2   Term  1   Preterm  1   AB      Living  2      SAB      IAB      Ectopic      Multiple  0   Live Births  2        Obstetric Comments  2012 PPROM          Home Medications    Prior to Admission medications   Medication Sig Start Date End Date Taking? Authorizing Provider  acetaminophen  (TYLENOL ) 500 MG tablet Take 500-1,000 mg by mouth every 6 (six) hours as needed (for pain  or headaches).    [provider]  albuterol  (PROVENTIL  HFA;VENTOLIN  HFA) 108 (90 Base) MCG/ACT inhaler Inhale 1-2 puffs into the lungs every 6 (six) hours as needed for wheezing or shortness of breath.    [provider]  famotidine  (PEPCID ) 20 MG tablet Take 1 tablet (20 mg total) by mouth 2 (two) times daily. 07/09/23   Claudene Mort A, DO  LORazepam  (ATIVAN ) 0.5 MG tablet Take 1 tablet (0.5 mg total) by mouth as needed. 04/20/22   Arfeen, Leni DASEN, MD  metoCLOPramide  (REGLAN ) 10 MG tablet Take 1 tablet (10 mg total) by mouth every 8 (eight) hours as needed for refractory nausea / vomiting (nausea / vomiting refractory to zofran ). 07/09/23   Claudene Mort A, DO  ondansetron  (ZOFRAN -ODT) 4 MG disintegrating tablet Take 1 tablet (4 mg total) by mouth every 8 (eight) hours. 07/09/23   Claudene Mort LABOR, DO  Prenatal MV & Min w/FA-DHA (PRENATAL ADULT GUMMY/DHA/FA) 0.4-25 MG CHEW Chew 1 tablet by mouth.    [provider]  QUEtiapine  (SEROQUEL ) 300 MG tablet Take 1 tablet (  300 mg total) by mouth at bedtime. 05/01/22   Arfeen, Leni DASEN, MD    Family History Family History  Problem Relation Age of Onset   Hypertension Mother    Hypertension Father    High Cholesterol Father    Heart block Father        partial    Social History Social History   Tobacco Use   Smoking status: Never   Smokeless tobacco: Never  Vaping Use   Vaping status: Never Used  Substance Use Topics   Alcohol use: Not Currently   Drug use: No     Allergies   Hydromet [hydrocodone  bit-homatrop mbr], Morphine  and codeine, and Penicillins   Review of Systems Review of Systems  Constitutional:  Positive for fever.  Respiratory:  Positive for cough.      Physical Exam Triage Vital Signs ED Triage Vitals  Encounter Vitals Group     BP 11/06/23 1352 128/73     Girls Systolic BP Percentile --      Girls Diastolic BP Percentile --      Boys Systolic BP Percentile --      Boys Diastolic BP  Percentile --      Pulse Rate 11/06/23 1352 98     Resp 11/06/23 1352 20     Temp 11/06/23 1352 98.8 F (37.1 C)     Temp Source 11/06/23 1352 Oral     SpO2 11/06/23 1352 97 %     Weight --      Height --      Head Circumference --      Peak Flow --      Pain Score 11/06/23 1354 7     Pain Loc --      Pain Education --      Exclude from Growth Chart --    No data found.  Updated Vital Signs BP 128/73 (BP Location: Left Arm)   Pulse 98   Temp 98.8 F (37.1 C) (Oral)   Resp 20   LMP  (LMP Unknown) Comment: irreg cycles, wasn't tracking  SpO2 97%   Visual Acuity Right Eye Distance:   Left Eye Distance:   Bilateral Distance:    Right Eye Near:   Left Eye Near:    Bilateral Near:     Physical Exam Constitutional:      General: She is not in acute distress.    Appearance: Normal appearance. She is not ill-appearing, toxic-appearing or diaphoretic.  HENT:     Head: Normocephalic and atraumatic.     Right Ear: Tympanic membrane and ear canal normal.     Left Ear: Tympanic membrane and ear canal normal.     Nose: Congestion present.     Mouth/Throat:     Pharynx: Oropharynx is clear.  Eyes:     Conjunctiva/sclera: Conjunctivae normal.  Cardiovascular:     Rate and Rhythm: Normal rate and regular rhythm.     Pulses: Normal pulses.     Heart sounds: Normal heart sounds.  Pulmonary:     Effort: Pulmonary effort is normal.     Breath sounds: Normal breath sounds.  Skin:    General: Skin is warm and dry.  Neurological:     Mental Status: She is alert.  Psychiatric:        Mood and Affect: Mood normal.     UC Treatments / Results  Labs (all labs ordered are listed, but only abnormal results are displayed) Labs Reviewed  POC COVID19/FLU A&B COMBO -  Normal  POCT RAPID STREP A (OFFICE) - Normal    EKG   Radiology No results found.  Procedures Procedures (including critical care time)  Medications Ordered in UC Medications - No data to  display  Initial Impression / Assessment and Plan / UC Course  I have reviewed the triage vital signs and the nursing notes.  Pertinent labs & imaging results that were available during my care of the patient were reviewed by me and considered in my medical decision making (see chart for details).     Sore throat and cough-no concerns on exam today.  COVID flu and strep test are negative.  This is most likely viral.  Recommend symptomatic treatment for relief of symptoms. Rest, hydrate and follow-up as needed  Final Clinical Impressions(s) / UC Diagnoses   Final diagnoses:  Sore throat     Discharge Instructions      Your covid, flu and strep test are negative. This is most likely a viral illness.  You can take tylenol  for pain.  Mucinex DM or Robitussin DM for cough.  Zyrtec for post nasal drip and congestion.      ED Prescriptions   None    PDMP not reviewed this encounter.   Adah Wilbert LABOR, FNP 11/07/23 [phone]

## 2023-11-06 NOTE — ED Triage Notes (Addendum)
 Patient seen by pcp yesterday for cough, body aches,  and onset of sore throat. Seen at a different urgent care 1 week ago and was prescribed amoxicillin. Has finished antibiotics with no improvement. States fever as well. Desires covid/flu testing.

## 2023-11-09 DIAGNOSIS — R002 Palpitations: Secondary | ICD-10-CM

## 2023-11-10 ENCOUNTER — Inpatient Hospital Stay (HOSPITAL_COMMUNITY)

## 2023-11-10 ENCOUNTER — Encounter (HOSPITAL_COMMUNITY): Payer: Self-pay | Admitting: Obstetrics and Gynecology

## 2023-11-10 ENCOUNTER — Other Ambulatory Visit: Payer: Self-pay

## 2023-11-10 ENCOUNTER — Ambulatory Visit

## 2023-11-10 ENCOUNTER — Inpatient Hospital Stay (HOSPITAL_COMMUNITY): Admission: AD | Admit: 2023-11-10 | Discharge: 2023-11-10 | Disposition: A | Discharge status: Home / Self Care

## 2023-11-10 DIAGNOSIS — J40 Bronchitis, not specified as acute or chronic: Secondary | ICD-10-CM

## 2023-11-10 DIAGNOSIS — O26892 Other specified pregnancy related conditions, second trimester: Secondary | ICD-10-CM

## 2023-11-10 DIAGNOSIS — Z3A26 26 weeks gestation of pregnancy: Secondary | ICD-10-CM

## 2023-11-10 DIAGNOSIS — R059 Cough, unspecified: Secondary | ICD-10-CM | POA: Diagnosis present

## 2023-11-10 DIAGNOSIS — R911 Solitary pulmonary nodule: Secondary | ICD-10-CM | POA: Insufficient documentation

## 2023-11-10 DIAGNOSIS — O99512 Diseases of the respiratory system complicating pregnancy, second trimester: Secondary | ICD-10-CM | POA: Diagnosis not present

## 2023-11-10 LAB — CBC WITH DIFFERENTIAL/PLATELET
Abs Immature Granulocytes: 0.16 K/uL — ABNORMAL HIGH (ref 0.00–0.07)
Basophils Absolute: 0.1 K/uL (ref 0.0–0.1)
Basophils Relative: 1 %
Eosinophils Absolute: 0.1 K/uL (ref 0.0–0.5)
Eosinophils Relative: 1 %
HCT: 37.4 % (ref 36.0–46.0)
Hemoglobin: 12.7 g/dL (ref 12.0–15.0)
Immature Granulocytes: 2 %
Lymphocytes Relative: 12 %
Lymphs Abs: 1 K/uL (ref 0.7–4.0)
MCH: 30 pg (ref 26.0–34.0)
MCHC: 34 g/dL (ref 30.0–36.0)
MCV: 88.4 fL (ref 80.0–100.0)
Monocytes Absolute: 0.7 K/uL (ref 0.1–1.0)
Monocytes Relative: 9 %
Neutro Abs: 6.2 K/uL (ref 1.7–7.7)
Neutrophils Relative %: 75 %
Platelets: 243 K/uL (ref 150–400)
RBC: 4.23 MIL/uL (ref 3.87–5.11)
RDW: 15.5 % (ref 11.5–15.5)
WBC: 8.2 K/uL (ref 4.0–10.5)
nRBC: 0 % (ref 0.0–0.2)

## 2023-11-10 LAB — RESP PANEL BY RT-PCR (RSV, FLU A&B, COVID)  RVPGX2
Influenza A by PCR: NEGATIVE
Influenza B by PCR: NEGATIVE
Resp Syncytial Virus by PCR: NEGATIVE
SARS Coronavirus 2 by RT PCR: NEGATIVE

## 2023-11-10 LAB — COMPREHENSIVE METABOLIC PANEL WITH GFR
ALT: 15 U/L (ref 0–44)
AST: 22 U/L (ref 15–41)
Albumin: 3.1 g/dL — ABNORMAL LOW (ref 3.5–5.0)
Alkaline Phosphatase: 92 U/L (ref 38–126)
Anion gap: 15 (ref 5–15)
BUN: <5 mg/dL — ABNORMAL LOW (ref 6–20)
CO2: 17 mmol/L — ABNORMAL LOW (ref 22–32)
Calcium: 8.6 mg/dL — ABNORMAL LOW (ref 8.9–10.3)
Chloride: 104 mmol/L (ref 98–111)
Creatinine, Ser: 0.51 mg/dL (ref 0.44–1.00)
GFR, Estimated: >60 mL/min (ref >60)
Glucose, Bld: 107 mg/dL — ABNORMAL HIGH (ref 70–99)
Potassium: 3.2 mmol/L — ABNORMAL LOW (ref 3.5–5.1)
Sodium: 136 mmol/L (ref 135–145)
Total Bilirubin: 0.5 mg/dL (ref 0.0–1.2)
Total Protein: 6.3 g/dL — ABNORMAL LOW (ref 6.5–8.1)

## 2023-11-10 LAB — LACTIC ACID, PLASMA: Lactic Acid, Venous: 0.8 mmol/L (ref 0.5–1.9)

## 2023-11-10 LAB — URINALYSIS, ROUTINE W REFLEX MICROSCOPIC
Bacteria, UA: NONE SEEN
Bilirubin Urine: NEGATIVE
Glucose, UA: NEGATIVE mg/dL
Hgb urine dipstick: NEGATIVE
Ketones, ur: NEGATIVE mg/dL
Leukocytes,Ua: NEGATIVE
Nitrite: NEGATIVE
Protein, ur: 30 mg/dL — AB
Specific Gravity, Urine: 1.023 (ref 1.005–1.030)
pH: 6 (ref 5.0–8.0)

## 2023-11-10 MED ORDER — CYCLOBENZAPRINE HCL 5 MG PO TABS
5.0000 mg | ORAL_TABLET | ORAL | Status: AC
  Administered 2023-11-10: 5 mg via ORAL
  Filled 2023-11-10: qty 1

## 2023-11-10 MED ORDER — LACTATED RINGERS IV BOLUS
1000.0000 mL | Freq: Once | INTRAVENOUS | Status: AC
  Administered 2023-11-10: 1000 mL via INTRAVENOUS

## 2023-11-10 MED ORDER — ACETAMINOPHEN 500 MG PO TABS
1000.0000 mg | ORAL_TABLET | Freq: Once | ORAL | Status: AC
  Administered 2023-11-10: 1000 mg via ORAL
  Filled 2023-11-10: qty 2

## 2023-11-10 MED ORDER — DEXTROMETHORPHAN POLISTIREX ER 30 MG/5ML PO SUER
30.0000 mg | Freq: Once | ORAL | Status: AC
  Administered 2023-11-10: 30 mg via ORAL
  Filled 2023-11-10: qty 5

## 2023-11-10 MED ORDER — CYCLOBENZAPRINE HCL 5 MG PO TABS: 5.0000 mg | ORAL_TABLET | Freq: Three times a day (TID) | ORAL | 0 refills | Status: AC | PRN

## 2023-11-10 MED ORDER — FLUTICASONE PROPIONATE 50 MCG/ACT NA SUSP: 2.0000 | Freq: Every day | NASAL | 0 refills | Status: AC

## 2023-11-10 MED ORDER — PANTOPRAZOLE SODIUM 40 MG PO TBEC: 40.0000 mg | DELAYED_RELEASE_TABLET | Freq: Every day | ORAL | 0 refills | Status: DC

## 2023-11-10 MED ORDER — BUDESONIDE-FORMOTEROL FUMARATE 80-4.5 MCG/ACT IN AERO: 2.0000 | INHALATION_SPRAY | Freq: Two times a day (BID) | RESPIRATORY_TRACT | 0 refills | Status: AC

## 2023-11-10 MED ORDER — PROMETHAZINE HCL 25 MG RE SUPP: 25.0000 mg | Freq: Four times a day (QID) | RECTAL | 0 refills | Status: DC | PRN

## 2023-11-10 NOTE — MAU Provider Note (Signed)
 History     CSN: [id number]  Arrival date and time: 11/10/23 1238 First Provider Initiated Contact with Patient 11/10/2023  1:45 PM  Chief Complaint  Patient presents with   Cough   Abdominal Pain   Sore Throat   Fever    HPI Chelsea [name] is a 38 y.o. [id number] at [redacted]w[redacted]d, 02/10/2024, by Ultrasound, who presents to the Maternity Assessment Unit for cough/congestions. She has had a cough for about 4 week, she was seen at [facility] and received amoxicillin. Pt sates this did not relieve her sx for any period of time. Has tried OTC cold/flu and Mucinex without relief. She had a negative quad screen 4 days ago. She began to feel worse 3 days ago with additional sx. Fever at home of 102.0. Her husband and children are also sick, and her husband was dx with PNA today.   She has been using albuterol  monring and night when symptoms are the wrost, but she is not experiencing the same relief she would normally get. She reports using albuterol  approx q2d outside of pregnancy.   ROS (+) productive cough, congestion, sore throat, sore abdomen, f/c, n/v, decreased po intake, cramping, stress UI, (-) diarrhea, VB, LOF  Medications Prior to Admission  Medication Sig Dispense Refill Last Dose/Taking   acetaminophen  (TYLENOL ) 500 MG tablet Take 500-1,000 mg by mouth every 6 (six) hours as needed (for pain or headaches).      albuterol  (PROVENTIL  HFA;VENTOLIN  HFA) 108 (90 Base) MCG/ACT inhaler Inhale 1-2 puffs into the lungs every 6 (six) hours as needed for wheezing or shortness of breath.      famotidine  (PEPCID ) 20 MG tablet Take 1 tablet (20 mg total) by mouth 2 (two) times daily. 60 tablet 0    LORazepam  (ATIVAN ) 0.5 MG tablet Take 1 tablet (0.5 mg total) by mouth as needed. 15 tablet 0    metoCLOPramide  (REGLAN ) 10 MG tablet Take 1 tablet (10 mg total) by mouth every 8 (eight) hours as needed for refractory nausea / vomiting (nausea / vomiting refractory to zofran ). 90 tablet 0    ondansetron  (ZOFRAN -ODT) 4 MG  disintegrating tablet Take 1 tablet (4 mg total) by mouth every 8 (eight) hours. 90 tablet 0    Prenatal MV & Min w/FA-DHA (PRENATAL ADULT GUMMY/DHA/FA) 0.4-25 MG CHEW Chew 1 tablet by mouth.      QUEtiapine  (SEROQUEL ) 300 MG tablet Take 1 tablet (300 mg total) by mouth at bedtime. 30 tablet 0     Past Medical History:  Diagnosis Date   Anxiety    Asthma    Atypical chest pain    Back pain    Bipolar affective disorder (HCC)    Complication of anesthesia    woke up during surgery   Depression    Hyperlipidemia    UTI (urinary tract infection)     Past Surgical History:  Procedure Laterality Date   NASAL SEPTUM SURGERY  10/2022   TONSILLECTOMY       Allergies:  Allergies  Allergen Reactions   Hydromet [Hydrocodone  Bit-Homatrop Mbr] Anaphylaxis, Itching and Swelling    Patient states that her throat closes   Morphine  And Codeine Itching and Other (See Comments)    Makes entire burn and itch SEVERELY; clawed her skin   Penicillins Palpitations    Has patient had a PCN reaction causing immediate rash, facial/tongue/throat swelling, SOB or lightheadedness with hypotension: Yes Has patient had a PCN reaction causing severe rash involving mucus membranes or skin necrosis: No Has patient  had a PCN reaction that required hospitalization: No Has patient had a PCN reaction occurring within the last 10 years: Yes If all of the above answers are NO, then may proceed with Cephalosporin use.     ROS reviewed and pertinent positives and negatives as documented in HPI.    Physical Exam  BP 126/83 (BP Location: Right Arm)   Pulse 96   Temp 97.8 F (36.6 C) (Oral)   Resp (!) 24   Ht 5' 3 (1.6 m)   Wt 81.2 kg   LMP  (LMP Unknown) Comment: irreg cycles, wasn't tracking  SpO2 96%   BMI 31.73 kg/m   Gen: alert, no acute distress CV: tachycardic rate, regular rhythm Resp: coughing, nonlabored. Dry crackles, slight expiratory wheeze, no consolidation Abd: gravid  Cervical  Exam  N/A  FHT Baseline: 140 bpm Variability: Good {> 6 bpm) Accelerations: Reactive, 10x10 appropriate for GA Decelerations: occasional variable Uterine activity: None, toco capturing coughing  Labs --/--/B NEG (06/12 1244)   No results found for this or any previous visit (from the past 24 hours).  Imaging EKG per my interpretation: Tachy rate, no ST elevation/depression. Isolated inverted T waves in III and aVF. Normal QT.     DG Chest Portable 1 View IMPRESSION: 1. Small nodular density visualized in the left midlung. CT scan is recommended for further evaluation.   Electronically signed by: Lynwood Seip M  Assessment and Plan  MDM Chelsea [name] is a 38 y.o. [id number] at [redacted]w[redacted]d, 02/10/2024, by Ultrasound, who presents to the MAU for cough x4 weeks. Ddx: PNA atypical bacteria, bronchitis, pertussis, back-to-back viral illness, PE, uncontrolled/exacerbated asthma, uncontrolled/exacerbated GERD. COVID/flu/RSV negative, etiology could still be other viral illness. CXR not c/w pneumonia, small nodule noted but unlikely to be the cause of current sx. Do not suspect asthma exacerbation particularly since albuterol  is not helpful as it has been in the past. For this reason, consider GERD as etiology of cough. Unlikely PE based on cough as primary symptom and duration and EKG.   Pt feeling slightly improved after Delsym and LR bolus. She is agreeable to more aggressive management of both asthma and GERD. Start single maintenance and reliver theraphy inhaler in place of albuertol, add PPI to H2 blocker for GERD. Rx suppository antiemetic as patient has not been able to tolerate pills without vomiting. Discussed that incidental lung nodule is unlikely to be the cause of presenting symptoms, recommend f/u with her PCP.  1. Bronchitis (Primary)  2. [redacted] weeks gestation of pregnancy  3. Nodule of left lung  Allergies as of 11/10/2023       Reactions   Hydromet [hydrocodone  Bit-homatrop Mbr]  Anaphylaxis, Itching, Swelling   Patient states that her throat closes   Morphine  And Codeine Itching, Other (See Comments)   Makes entire burn and itch SEVERELY; clawed her skin   Penicillins Palpitations   Has patient had a PCN reaction causing immediate rash, facial/tongue/throat swelling, SOB or lightheadedness with hypotension: Yes Has patient had a PCN reaction causing severe rash involving mucus membranes or skin necrosis: No Has patient had a PCN reaction that required hospitalization: No Has patient had a PCN reaction occurring within the last 10 years: Yes If all of the above answers are NO, then may proceed with Cephalosporin use.        Medication List     STOP taking these medications    albuterol  108 (90 Base) MCG/ACT inhaler Commonly known as: VENTOLIN  HFA   LORazepam  0.5 MG  tablet Commonly known as: ATIVAN        TAKE these medications    acetaminophen  500 MG tablet Commonly known as: TYLENOL  Take 500-1,000 mg by mouth every 6 (six) hours as needed (for pain or headaches).   budesonide-formoterol 80-4.5 MCG/ACT inhaler Commonly known as: Symbicort Inhale 2 puffs into the lungs 2 (two) times daily. Can use 2 puffs as needed every 4 hours for wheezing or shortness of breath.  Maximum 12 total puffs per day. Use spacer.   cyclobenzaprine 5 MG tablet Commonly known as: FLEXERIL Take 1 tablet (5 mg total) by mouth 3 (three) times daily as needed for up to 7 days for muscle spasms.   famotidine  20 MG tablet Commonly known as: PEPCID  Take 1 tablet (20 mg total) by mouth 2 (two) times daily.   fluticasone 50 MCG/ACT nasal spray Commonly known as: FLONASE Place 2 sprays into both nostrils daily.   metoCLOPramide  10 MG tablet Commonly known as: REGLAN  Take 1 tablet (10 mg total) by mouth every 8 (eight) hours as needed for refractory nausea / vomiting (nausea / vomiting refractory to zofran ).   ondansetron  4 MG disintegrating tablet Commonly known as:  ZOFRAN -ODT Take 1 tablet (4 mg total) by mouth every 8 (eight) hours.   pantoprazole 40 MG tablet Commonly known as: Protonix Take 1 tablet (40 mg total) by mouth daily before breakfast.   Prenatal Adult Gummy/DHA/FA 0.4-25 MG Chew Chew 1 tablet by mouth.   promethazine 25 MG suppository Commonly known as: PHENERGAN Place 1 suppository (25 mg total) rectally every 6 (six) hours as needed for nausea or vomiting. Can be placed vaginally instead of rectally.   QUEtiapine  300 MG tablet Commonly known as: SEROQUEL  Take 1 tablet (300 mg total) by mouth at bedtime.         Results pending at the time of DC: pertussis PCR Dispo: DC home in stable condition with return precautions discussed and included in AVS.    Barabara Maier, DO FMOB Fellow, Faculty Practice [facility], [facility]

## 2023-11-10 NOTE — MAU Note (Signed)
 Chelsea [name] is a 38 y.o. at [redacted]w[redacted]d here in MAU reporting: ongoing cold and flu symptoms for 2 weeks now. Started getting worse 3 days ago. Was negative for covid, flu, strep and rsv 4 days ago. Is c/o productive cough that is brown, sore throat, congestion, rib pain, fever, chills, abdominal cramping, nausea, vomiting, SOB, and chest pain. Lower abdominal cramping started 2 days ago. Denies any LOF or VB. States she has peed on herself every time she coughs. Has not felt any fetal movement for 2-3 days. Husband + for pneumonia today.  Has tried OTC cold and flu meds, has gone through 3 bottles of mucinex with no relief. Had amoxicillin 3-4 weeks ago for a dry cough and completed course with no relief.  Onset of complaint: ongoing Pain score: rib pain 10  sore throat 8  chest pain 8 Vitals:   11/10/23 1307  BP: 126/83  Pulse: 96  Resp: (!) 24  Temp: 97.8 F (36.6 C)  SpO2: 96%     FHT:142 Lab orders placed from triage:  resp swab

## 2023-11-10 NOTE — Discharge Instructions (Addendum)
-   for Flonase nasal spray, be sure to point it backwards and outwards. Gently inhale while you spray. You should not taste it going down the back of your throat. If you do, no need to repeat the spray, but readjust your position next time - if Flonase prescription is not affordable, check the aisles for fluticasone nasal spray - if Symbicort prescription is not affordable, ask the pharmacist what alternatives are covered by your insurance plan - try Delsym to help with the cough - to help reduce heartburn, avoid spicy and acidic foods, eat multiple smaller meals instead of fewer larger meals, avoid over eating, and be sure to stay upright for at least after meals                    Safe Medications in Pregnancy    Acne: Benzoyl Peroxide Salicylic Acid  Backache/Headache: Tylenol : 2 regular strength every 4 hours OR              2 Extra strength every 6 hours  Colds/Coughs/Allergies: Benadryl  (alcohol free) 25 mg every 6 hours as needed Breath right strips Claritin Cepacol throat lozenges Chloraseptic throat spray Cold-Eeze- up to three times per day Cough drops, alcohol free Delsym Flonase (by prescription only) Guaifenesin Mucinex Robitussin DM (plain only, alcohol free) Saline nasal spray/drops Sudafed (pseudoephedrine) & Actifed ** use only after [redacted] weeks gestation and if you do not have high blood pressure Tylenol  Vicks Vaporub Zinc lozenges Zyrtec   Constipation: Colace Ducolax suppositories Fleet enema Glycerin  suppositories Metamucil Milk of magnesia Miralax Senokot Smooth move tea  Diarrhea: Kaopectate Imodium A-D  *NO pepto Bismol  Hemorrhoids: Anusol Anusol HC Preparation H Tucks  Indigestion: Tums Maalox Mylanta Zantac  Pepcid   Insomnia: Benadryl  (alcohol free) 25mg  every 6 hours as needed Tylenol  PM Unisom, no Gelcaps  Leg Cramps: Tums MagGel  Nausea/Vomiting:  Bonine Dramamine Emetrol Ginger extract Sea  bands Meclizine  Nausea medication to take during pregnancy:  Unisom (doxylamine succinate 25 mg tablets) Take one tablet daily at bedtime. If symptoms are not adequately controlled, the dose can be increased to a maximum recommended dose of two tablets daily (1/2 tablet in the morning, 1/2 tablet mid-afternoon and one at bedtime). Vitamin B6 100mg  tablets. Take one tablet twice a day (up to 200 mg per day).  Skin Rashes: Aveeno products Benadryl  cream or 25mg  every 6 hours as needed Calamine Lotion 1% cortisone cream  Yeast infection: Gyne-lotrimin 7 Monistat 7   **If taking multiple medications, please check labels to avoid duplicating the same active ingredients **take medication as directed on the label ** Do not exceed 4000 mg of tylenol  in 24 hours **Do not take medications that contain aspirin or ibuprofen

## 2023-11-12 ENCOUNTER — Ambulatory Visit: Payer: Self-pay

## 2023-11-12 LAB — BORDETELLA PERTUSSIS PCR
B parapertussis, DNA: NEGATIVE
B pertussis, DNA: NEGATIVE

## 2023-11-29 IMAGING — DX DG KNEE 3 VIEWS*L*
3 series · 3 of 3 positions shown · non-contrast
Comparison: None.

CLINICAL DATA: Bilateral knee pain, right worse than left

EXAM:
LEFT KNEE - 3 VIEW; RIGHT KNEE - 3 VIEW

[dg knee 3 views left (1 of 3)]
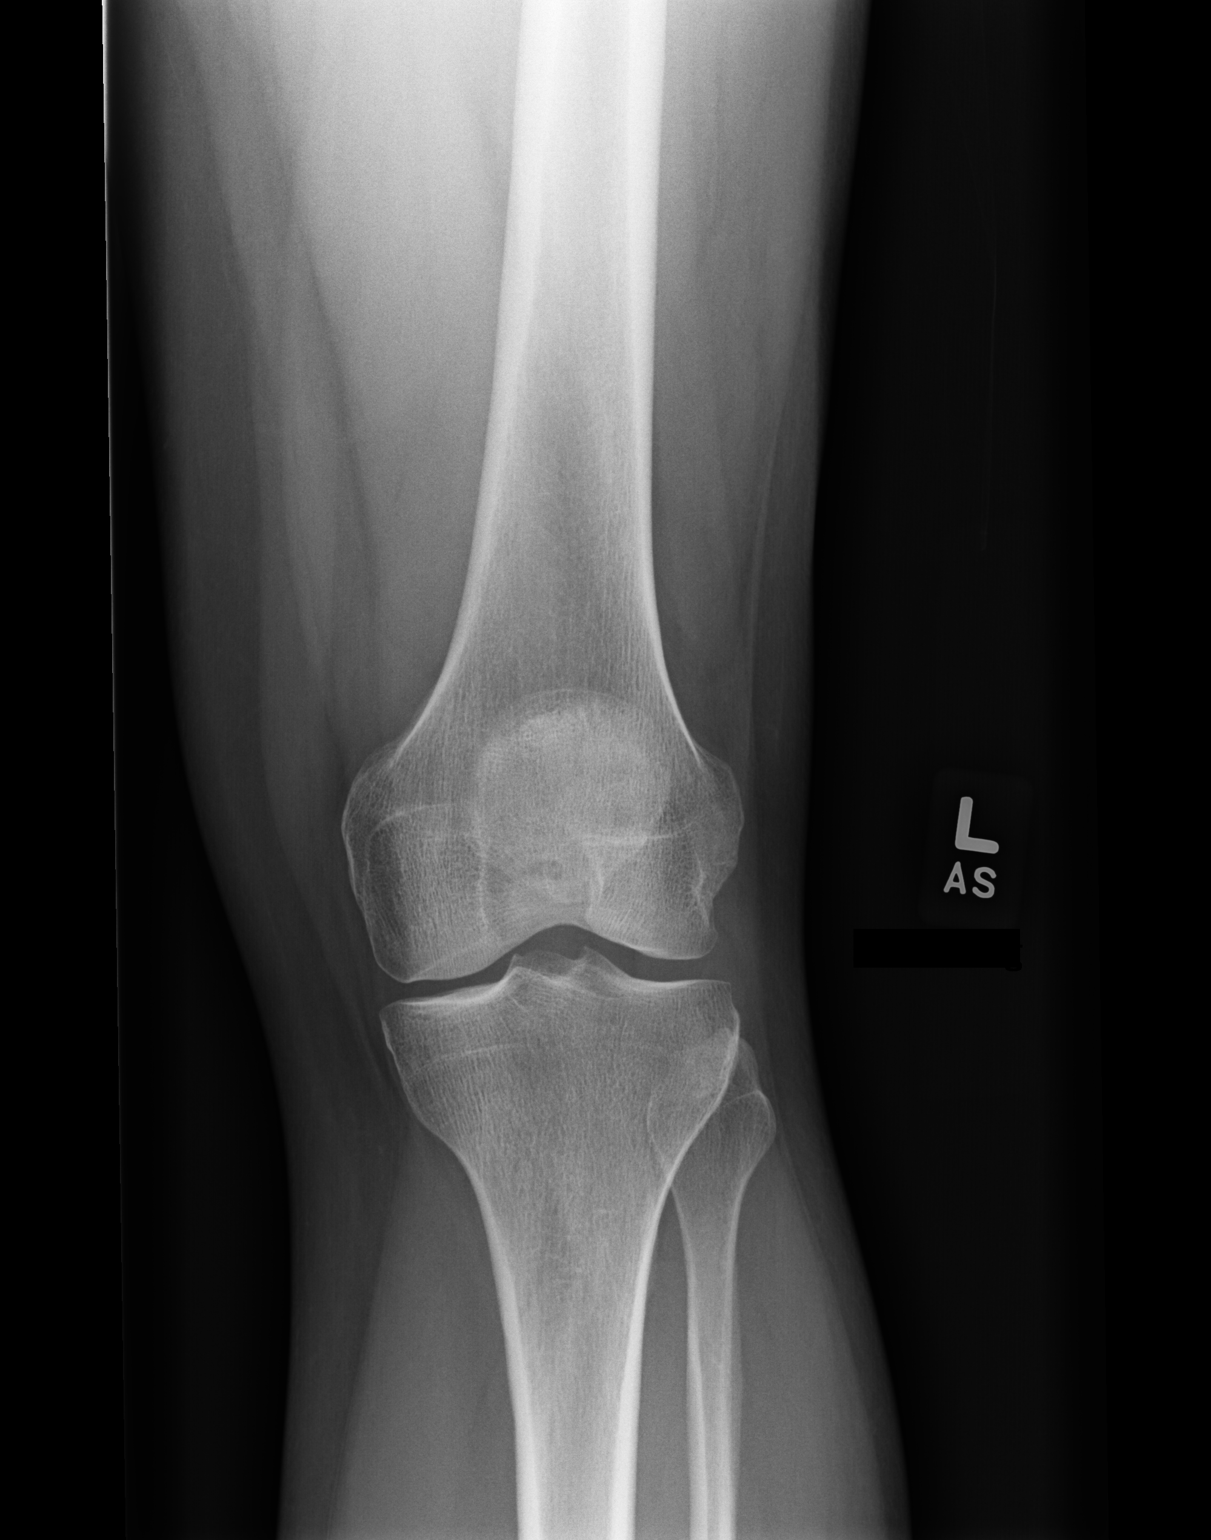

[dg knee 3 views left (2 of 3)]
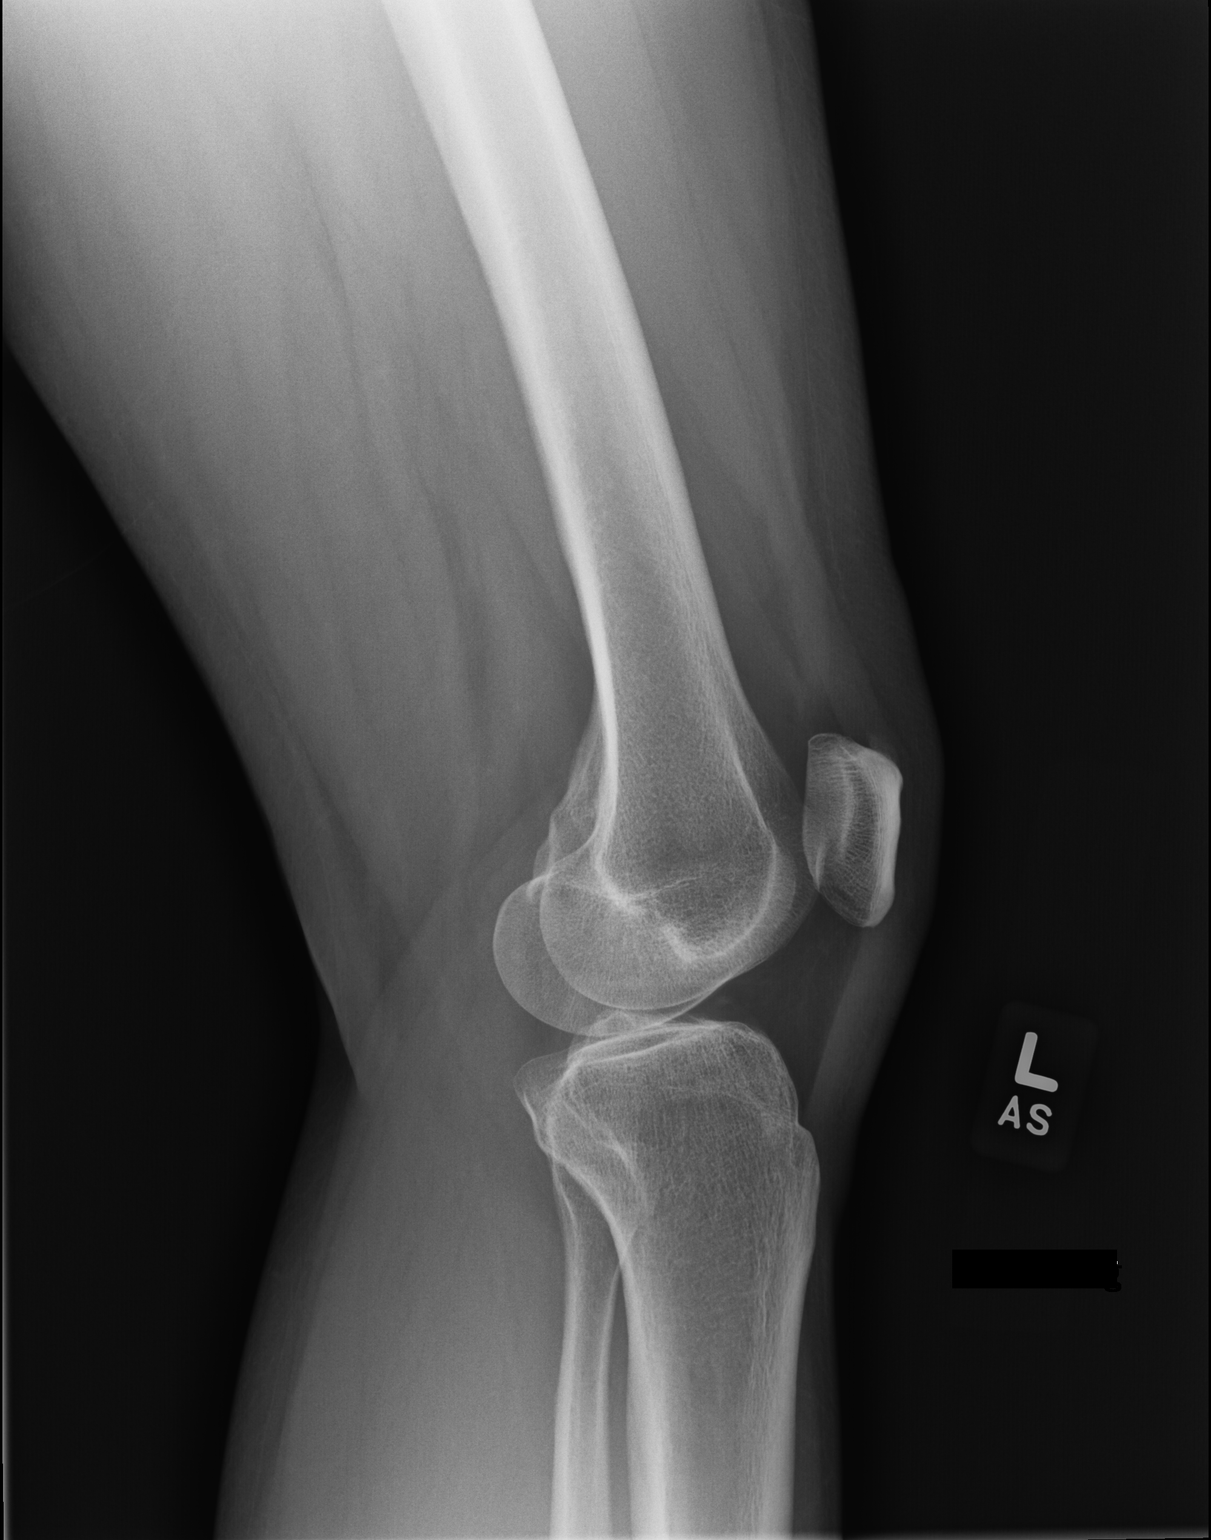

[dg knee 3 views left (3 of 3)]
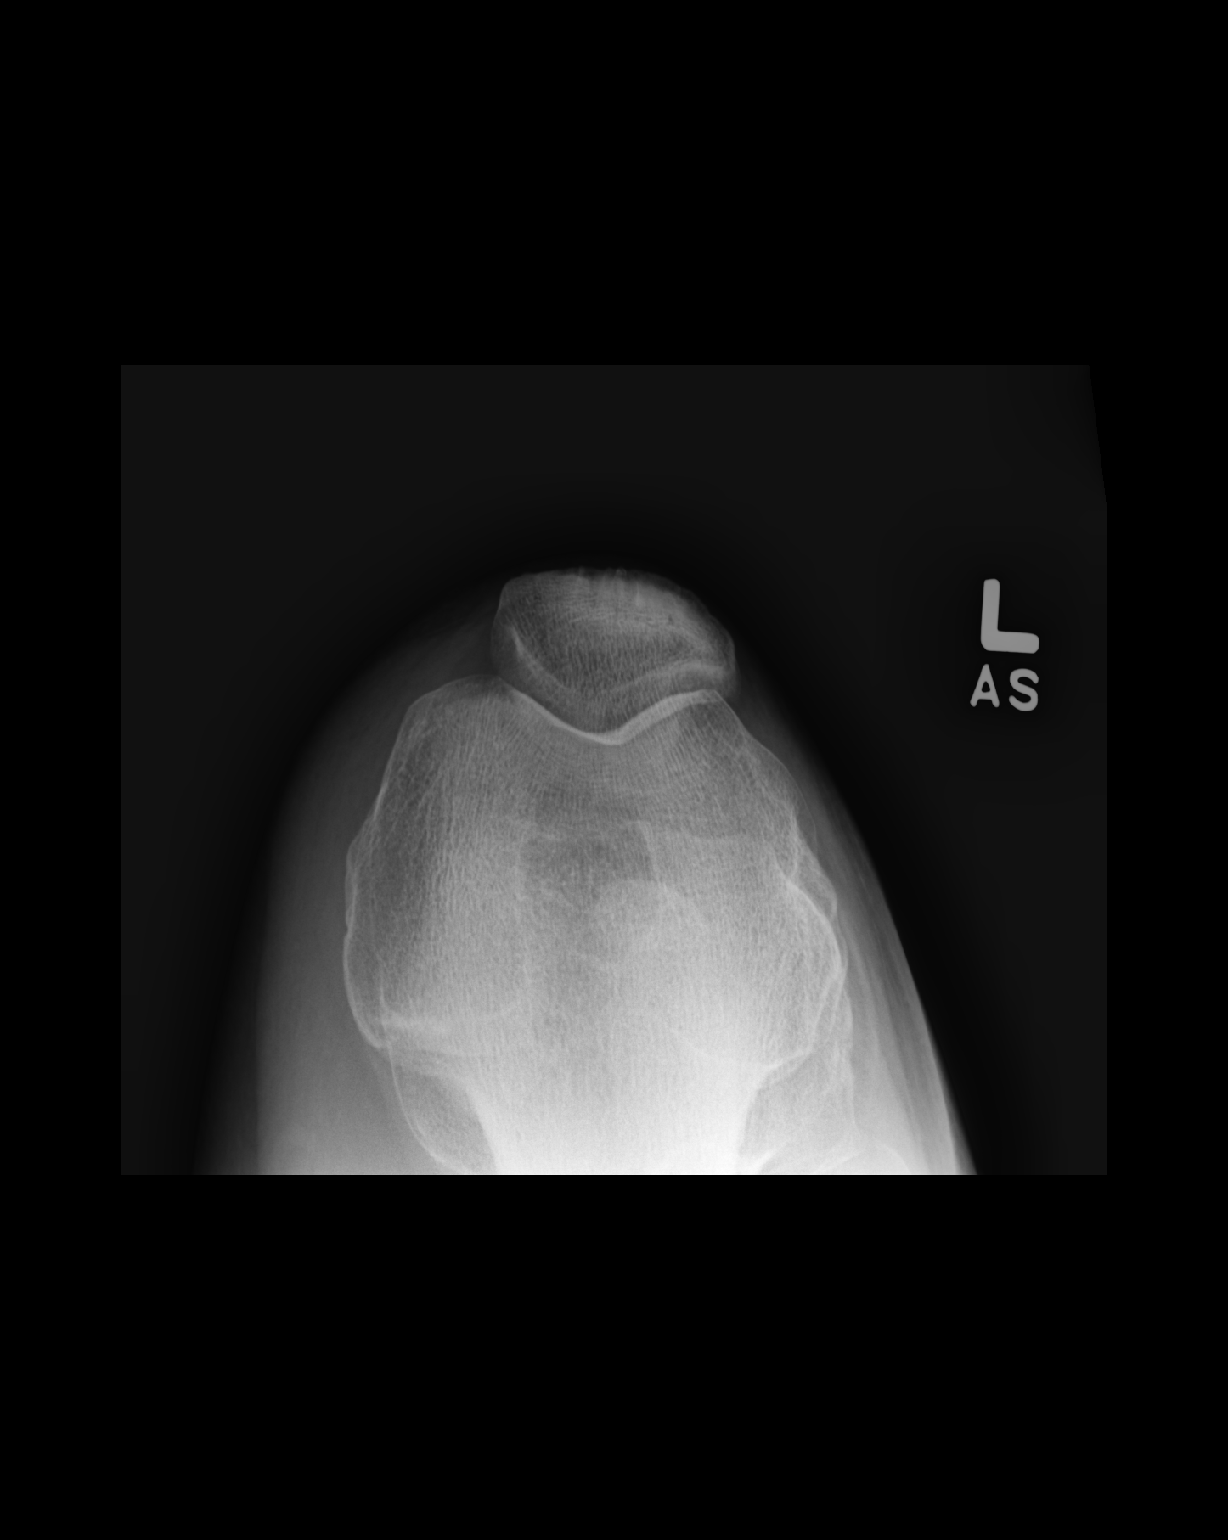

[3 of 3 positions shown; findings below may reference images not displayed]

FINDINGS: No evidence of fracture, dislocation, or joint effusion of either
knee. Joint spaces are well maintained. No evidence of arthropathy
or other focal bone abnormality. Soft tissues are unremarkable.
IMPRESSION: Negative.

## 2023-12-04 ENCOUNTER — Other Ambulatory Visit: Payer: Self-pay

## 2023-12-07 ENCOUNTER — Other Ambulatory Visit: Payer: Self-pay

## 2023-12-15 ENCOUNTER — Encounter: Payer: Self-pay | Admitting: *Deleted

## 2023-12-16 ENCOUNTER — Ambulatory Visit: Attending: Cardiology | Admitting: Cardiology

## 2023-12-16 ENCOUNTER — Encounter: Payer: Self-pay | Admitting: Cardiology

## 2023-12-16 VITALS — HR 99 | Resp 16 | Ht 63.0 in | Wt 178.0 lb

## 2023-12-16 DIAGNOSIS — Z3A32 32 weeks gestation of pregnancy: Secondary | ICD-10-CM

## 2023-12-16 DIAGNOSIS — I4729 Other ventricular tachycardia: Secondary | ICD-10-CM | POA: Diagnosis not present

## 2023-12-16 MED ORDER — METOPROLOL SUCCINATE ER 25 MG PO TB24: 12.5000 mg | ORAL_TABLET | Freq: Every day | ORAL | 3 refills | Status: DC

## 2023-12-16 NOTE — Progress Notes (Signed)
 [facility]  New Evaluation  Date:  12/16/2023   ID:  Chelsea [name], [place] 07/12/1985, MRN [id number]  PCP:  Alben Therisa MATSU, PA   [facility] Providers Cardiologist:  Dub Huntsman, DO  Electrophysiologist:  None       Referring MD: Alben Therisa MATSU, PA   Chief Complaint:  I experiencing more pain patient's  She is at home. I am in the office.  Virtual Visit via Video Visit  Note . I connected with the patient today by a telephone enabled telemedicine application and verified that I am speaking with the correct person using two identifiers.  History of Present Illness:    Chelsea [name] is a 38 y.o. female [G3P1102] is here today for follow-up visit.  She tells me she still is experiencing significant palpitations.  She is concerned about this. She notes that she is experiencing increasing palpitations that brought outside sometimes happening mostly at night.  Wakes her up from her sleep.   She also shares with me that her family members have been experiencing cold and flu symptoms recently.   Prior CV Studies Reviewed: The following studies were reviewed today: Echo reviewed   Past Medical History:  Diagnosis Date   Anxiety    Asthma    Atypical chest pain    Back pain    Bipolar affective disorder (HCC)    Complication of anesthesia    woke up during surgery   Depression    Hyperlipidemia    UTI (urinary tract infection)     Past Surgical History:  Procedure Laterality Date   NASAL SEPTUM SURGERY  10/2022   TONSILLECTOMY        OB History     Gravida  3   Para  2   Term  1   Preterm  1   AB      Living  2      SAB      IAB      Ectopic      Multiple  0   Live Births  2        Obstetric Comments  2012 PPROM             Current Medications: Current Meds  Medication Sig   acetaminophen  (TYLENOL ) 500 MG tablet Take 500-1,000 mg by mouth every 6 (six) hours as needed (for pain or headaches).    budesonide-formoterol (SYMBICORT) 80-4.5 MCG/ACT inhaler Inhale 2 puffs into the lungs 2 (two) times daily. Can use 2 puffs as needed every 4 hours for wheezing or shortness of breath.  Maximum 12 total puffs per day. Use spacer.   famotidine  (PEPCID ) 20 MG tablet Take 1 tablet (20 mg total) by mouth 2 (two) times daily.   fluticasone (FLONASE) 50 MCG/ACT nasal spray Place 2 sprays into both nostrils daily.   metoCLOPramide  (REGLAN ) 10 MG tablet Take 1 tablet (10 mg total) by mouth every 8 (eight) hours as needed for refractory nausea / vomiting (nausea / vomiting refractory to zofran ).   metoprolol succinate (TOPROL XL) 25 MG 24 hr tablet Take 0.5 tablets (12.5 mg total) by mouth daily.   ondansetron  (ZOFRAN -ODT) 4 MG disintegrating tablet Take 1 tablet (4 mg total) by mouth every 8 (eight) hours.   pantoprazole (PROTONIX) 40 MG tablet Take 1 tablet (40 mg total) by mouth daily before breakfast.   Prenatal MV & Min w/FA-DHA (PRENATAL ADULT GUMMY/DHA/FA) 0.4-25 MG CHEW Chew 1 tablet by mouth.   promethazine (PHENERGAN) 25 MG suppository  Place 1 suppository (25 mg total) rectally every 6 (six) hours as needed for nausea or vomiting. Can be placed vaginally instead of rectally.   QUEtiapine  (SEROQUEL ) 300 MG tablet Take 1 tablet (300 mg total) by mouth at bedtime.     Allergies:   Hydromet [hydrocodone  bit-homatrop mbr], Morphine  and codeine, and Penicillins   Social History   Socioeconomic History   Marital status: Married    Spouse name: Not on file   Number of children: Not on file   Years of education: Not on file   Highest education level: Not on file  Occupational History   Not on file  Tobacco Use   Smoking status: Never   Smokeless tobacco: Never  Vaping Use   Vaping status: Never Used  Substance and Sexual Activity   Alcohol use: Not Currently   Drug use: No   Sexual activity: Yes  Other Topics Concern   Not on file  Social History Narrative   Not on file   Social Drivers  of Health   Financial Resource Strain: Not on file  Food Insecurity: No Food Insecurity (07/09/2023)   Hunger Vital Sign    Worried About Running Out of Food in the Last Year: Never true    Ran Out of Food in the Last Year: Never true  Transportation Needs: No Transportation Needs (07/09/2023)   PRAPARE - Administrator, Civil Service (Medical): No    Lack of Transportation (Non-Medical): No  Physical Activity: Not on file  Stress: Not on file  Social Connections: Not on file      Family History  Problem Relation Age of Onset   Hypertension Mother    Hypertension Father    High Cholesterol Father    Heart block Father        partial   Heart attack Paternal Grandfather       ROS:   Please see the history of present illness.    Palpitations All other systems reviewed and are negative.   Labs/EKG Reviewed:    EKG:   None today   Recent Labs: 10/05/2023: Magnesium 2.0 11/10/2023: ALT 15; BUN <5; Creatinine, Ser 0.51; Hemoglobin 12.7; Platelets 243; Potassium 3.2; Sodium 136   Recent Lipid Panel No results found for: CHOL, TRIG, HDL, CHOLHDL, LDLCALC, LDLDIRECT  Physical Exam:    VS:  Pulse 99   Resp 16   Ht 5' 3 (1.6 m)   Wt 178 lb (80.7 kg)   LMP  (LMP Unknown) Comment: irreg cycles, wasn't tracking  SpO2 99%   BMI 31.53 kg/m     Wt Readings from Last 3 Encounters:  12/16/23 178 lb (80.7 kg)  11/10/23 179 lb 1.6 oz (81.2 kg)  10/05/23 185 lb (83.9 kg)     GEN:  Well nourished, well developed in no acute distress HEENT: Normal NECK: No JVD; No carotid bruits LYMPHATICS: No lymphadenopathy CARDIAC: RRR, no murmurs, rubs, gallops RESPIRATORY:  Clear to auscultation without rales, wheezing or rhonchi  ABDOMEN: Soft, non-tender, non-distended MUSCULOSKELETAL:  No edema; No deformity  SKIN: Warm and dry NEUROLOGIC:  Alert and oriented x 3 PSYCHIATRIC:  Normal affect    Risk Assessment/Risk Calculators:                   ASSESSMENT & PLAN:    NSVT  Second Degree AV Block-Mobitz I (Wenckebach)   We discussed her monitor results show evidence of NSVT.  With her symptoms that is concerning we will go  ahead and start the patient on low-dose beta-blocker Toprol-XL 12.5 mg at nighttime.  She is agreeable.  Will see her in person in 2 weeks.  Total time spent 15 minutes.   Patient Instructions  Medication Instructions:  Your physician has recommended you make the following change in your medication:  START Toprol 12.5 mg once daily at bedtime  *If you need a refill on your cardiac medications before your next appointment, please call your pharmacy*   Follow-Up: At [facility], you and your health needs are our priority.  As part of our continuing mission to provide you with exceptional heart care, our providers are all part of one team.  This team includes your primary Cardiologist (physician) and Advanced Practice Providers or APPs (Physician Assistants and Nurse Practitioners) who all work together to provide you with the care you need, when you need it.  Your next appointment:   12/30/2023 at 10:20 am  Provider:   Emersen Mascari, DO   Thank you for choosing [facility]!!  [phone]   Other Instructions  Metoprolol Extended-Release Tablets What is this medication? METOPROLOL (me TOE proe lole) treats high blood pressure and heart failure. It may also be used to prevent chest pain (angina). It works by lowering your blood pressure and heart rate, making it easier for your heart to pump blood to the rest of your body. It belongs to a group of medications called beta blockers. This medicine may be used for other purposes; ask your health care provider or pharmacist if you have questions. COMMON BRAND NAME(S): toprol, Toprol XL, Toprol-XL What should I tell my care team before I take this medication? They need to know if you have any of these conditions: Diabetes Heart or blood  vessel conditions, such as slow heartbeat, worsening heart failure, heart block, sick sinus syndrome Liver disease Lung or breathing disease, such as asthma or COPD Pheochromocytoma Thyroid  disease An unusual or allergic reaction to metoprolol, other medications, foods, dyes, or preservatives Pregnant or trying to get pregnant Breastfeeding How should I use this medication? Take this medication by mouth with water. Take it as directed on the prescription label at the same time every day. You may cut the tablet in half if it is scored (has a line in the middle of it). This may help you swallow the tablet if the whole tablet is too big. Be sure to take both halves. Do not take just one-half of the tablet. You can take it with or without food. It is best to take it with food or after a meal. You should always take it the same way. Keep taking it unless your care team tells you to stop. Talk to your care team about the use of this medication in children. While it may be prescribed for children as young as 6 years for selected conditions, precautions do apply. Overdosage: If you think you have taken too much of this medicine contact a poison control center or emergency room at once. NOTE: This medicine is only for you. Do not share this medicine with others. What if I miss a dose? If you miss a dose, take it as soon as you can. If it is almost time for your next dose, take only that dose. Do not take double or extra doses. What may interact with this medication? Epinephrine MAOIs, such as Marplan, Nardil, and Parnate NSAIDS, medications for pain and inflammation, such as ibuprofen  or naproxen Some medications for blood pressure, heart disease,  irregular heartbeat Other medications may affect the way this medication works. Talk with your care team about all the medications you take. They may suggest changes to your treatment plan to lower the risk of side effects and to make sure your medications work  as intended. This list may not describe all possible interactions. Give your health care provider a list of all the medicines, herbs, non-prescription drugs, or dietary supplements you use. Also tell them if you smoke, drink alcohol, or use illegal drugs. Some items may interact with your medicine. What should I watch for while using this medication? Visit your care team for regular checks on your progress. Check your blood pressure as directed. Know what your blood pressure should be and when to contact your care team. Taking this medication is only part of a total heart healthy program. Ask your care team if there are other changes you can make to improve your overall health. Do not treat yourself for coughs, colds, or pain while you are using this medication without asking your care team for advice. Some medications may increase your blood pressure. This medication may affect your coordination, reaction time, or judgment. Do not drive or operate machinery until you know how this medication affects you. Sit up or stand slowly to reduce the risk of dizzy or fainting spells. Drinking alcohol with this medication can increase the risk of these side effects. This medication may affect blood glucose levels. It can also mask the symptoms of low blood sugar, such as a rapid heartbeat and tremors. If you have diabetes, it is important to check your blood sugar often while you are taking this medication. Do not suddenly stop taking this medication. This may increase your risk of side effects, such as chest pain and heart attack. If you no longer need to take this medication, your care team will lower the dose slowly over time to decrease the risk of side effects. If you are going to need surgery or a procedure, tell your care team that you are using this medication. What side effects may I notice from receiving this medication? Side effects that you should report to your care team as soon as possible: Allergic  reactions--skin rash, itching, hives, swelling of the face, lips, tongue, or throat Heart failure--shortness of breath, swelling of the ankles, feet, or hands, sudden weight gain, unusual weakness or fatigue Low blood pressure--dizziness, feeling faint or lightheaded, blurry vision Raynaud's--cool, numb, or painful fingers or toes that may change color from pale, to blue, to red Slow heartbeat--dizziness, feeling faint or lightheaded, confusion, trouble breathing, unusual weakness or fatigue Worsening mood, feelings of depression Side effects that usually do not require medical attention (report to your care team if they continue or are bothersome): Change in sex drive or performance Diarrhea Dizziness Fatigue Headache This list may not describe all possible side effects. Call your doctor for medical advice about side effects. You may report side effects to FDA at 1-800-FDA-1088. Where should I keep my medication? Keep out of the reach of children and pets. Store at room temperature between 20 and 25 degrees C (68 and 77 degrees F). Get rid of any unused medication after the expiration date. To get rid of medications that are no longer needed or have expired: Take the medication to a take-back program. Check with your pharmacy or law enforcement to find a location. If you cannot return the medication, check the label or package insert to see if the medication should be thrown  out in the garbage or flushed down the toilet. If you are not sure, ask your care team. If it is safe to put it in the trash, empty the medication out of the container. Mix it with cat litter, dirt, coffee grounds, or another unwanted substance. Seal the mixture in a bag or container. Put it in the trash. NOTE: This sheet is a summary. It may not cover all possible information. If you have questions about this medicine, talk to your doctor, pharmacist, or health care provider.  2025 Elsevier/Gold Standard (2023-05-26  00:00:00)       Dispo:  No follow-ups on file.   Medication Adjustments/Labs and Tests Ordered: Current medicines are reviewed at length with the patient today.  Concerns regarding medicines are outlined above.  Tests Ordered: No orders of the defined types were placed in this encounter.  Medication Changes: Meds ordered this encounter  Medications   metoprolol  succinate (TOPROL  XL) 25 MG 24 hr tablet    Sig: Take 0.5 tablets (12.5 mg total) by mouth daily.    Dispense:  15 tablet    Refill:  3

## 2023-12-16 NOTE — Patient Instructions (Addendum)
 Medication Instructions:  Your physician has recommended you make the following change in your medication:  START Toprol  12.5 mg once daily at bedtime  *If you need a refill on your cardiac medications before your next appointment, please call your pharmacy*   Follow-Up: At [facility], you and your health needs are our priority.  As part of our continuing mission to provide you with exceptional heart care, our providers are all part of one team.  This team includes your primary Cardiologist (physician) and Advanced Practice Providers or APPs (Physician Assistants and Nurse Practitioners) who all work together to provide you with the care you need, when you need it.  Your next appointment:   12/30/2023 at 10:20 am  Provider:   Kardie Tobb, DO   Thank you for choosing [facility]!!  [phone]   Other Instructions  Metoprolol  Extended-Release Tablets What is this medication? METOPROLOL  (me TOE proe lole) treats high blood pressure and heart failure. It may also be used to prevent chest pain (angina). It works by lowering your blood pressure and heart rate, making it easier for your heart to pump blood to the rest of your body. It belongs to a group of medications called beta blockers. This medicine may be used for other purposes; ask your health care provider or pharmacist if you have questions. COMMON BRAND NAME(S): toprol , Toprol  XL, Toprol -XL What should I tell my care team before I take this medication? They need to know if you have any of these conditions: Diabetes Heart or blood vessel conditions, such as slow heartbeat, worsening heart failure, heart block, sick sinus syndrome Liver disease Lung or breathing disease, such as asthma or COPD Pheochromocytoma Thyroid  disease An unusual or allergic reaction to metoprolol , other medications, foods, dyes, or preservatives Pregnant or trying to get pregnant Breastfeeding How should I use this medication? Take  this medication by mouth with water. Take it as directed on the prescription label at the same time every day. You may cut the tablet in half if it is scored (has a line in the middle of it). This may help you swallow the tablet if the whole tablet is too big. Be sure to take both halves. Do not take just one-half of the tablet. You can take it with or without food. It is best to take it with food or after a meal. You should always take it the same way. Keep taking it unless your care team tells you to stop. Talk to your care team about the use of this medication in children. While it may be prescribed for children as young as 6 years for selected conditions, precautions do apply. Overdosage: If you think you have taken too much of this medicine contact a poison control center or emergency room at once. NOTE: This medicine is only for you. Do not share this medicine with others. What if I miss a dose? If you miss a dose, take it as soon as you can. If it is almost time for your next dose, take only that dose. Do not take double or extra doses. What may interact with this medication? Epinephrine MAOIs, such as Marplan, Nardil, and Parnate NSAIDS, medications for pain and inflammation, such as ibuprofen  or naproxen Some medications for blood pressure, heart disease, irregular heartbeat Other medications may affect the way this medication works. Talk with your care team about all the medications you take. They may suggest changes to your treatment plan to lower the risk of side effects  and to make sure your medications work as intended. This list may not describe all possible interactions. Give your health care provider a list of all the medicines, herbs, non-prescription drugs, or dietary supplements you use. Also tell them if you smoke, drink alcohol, or use illegal drugs. Some items may interact with your medicine. What should I watch for while using this medication? Visit your care team for regular  checks on your progress. Check your blood pressure as directed. Know what your blood pressure should be and when to contact your care team. Taking this medication is only part of a total heart healthy program. Ask your care team if there are other changes you can make to improve your overall health. Do not treat yourself for coughs, colds, or pain while you are using this medication without asking your care team for advice. Some medications may increase your blood pressure. This medication may affect your coordination, reaction time, or judgment. Do not drive or operate machinery until you know how this medication affects you. Sit up or stand slowly to reduce the risk of dizzy or fainting spells. Drinking alcohol with this medication can increase the risk of these side effects. This medication may affect blood glucose levels. It can also mask the symptoms of low blood sugar, such as a rapid heartbeat and tremors. If you have diabetes, it is important to check your blood sugar often while you are taking this medication. Do not suddenly stop taking this medication. This may increase your risk of side effects, such as chest pain and heart attack. If you no longer need to take this medication, your care team will lower the dose slowly over time to decrease the risk of side effects. If you are going to need surgery or a procedure, tell your care team that you are using this medication. What side effects may I notice from receiving this medication? Side effects that you should report to your care team as soon as possible: Allergic reactions--skin rash, itching, hives, swelling of the face, lips, tongue, or throat Heart failure--shortness of breath, swelling of the ankles, feet, or hands, sudden weight gain, unusual weakness or fatigue Low blood pressure--dizziness, feeling faint or lightheaded, blurry vision Raynaud's--cool, numb, or painful fingers or toes that may change color from pale, to blue, to  red Slow heartbeat--dizziness, feeling faint or lightheaded, confusion, trouble breathing, unusual weakness or fatigue Worsening mood, feelings of depression Side effects that usually do not require medical attention (report to your care team if they continue or are bothersome): Change in sex drive or performance Diarrhea Dizziness Fatigue Headache This list may not describe all possible side effects. Call your doctor for medical advice about side effects. You may report side effects to FDA at 1-800-FDA-1088. Where should I keep my medication? Keep out of the reach of children and pets. Store at room temperature between 20 and 25 degrees C (68 and 77 degrees F). Get rid of any unused medication after the expiration date. To get rid of medications that are no longer needed or have expired: Take the medication to a take-back program. Check with your pharmacy or law enforcement to find a location. If you cannot return the medication, check the label or package insert to see if the medication should be thrown out in the garbage or flushed down the toilet. If you are not sure, ask your care team. If it is safe to put it in the trash, empty the medication out of the container. Mix it  with cat litter, dirt, coffee grounds, or another unwanted substance. Seal the mixture in a bag or container. Put it in the trash. NOTE: This sheet is a summary. It may not cover all possible information. If you have questions about this medicine, talk to your doctor, pharmacist, or health care provider.  2025 Elsevier/Gold Standard (2023-05-26 00:00:00)

## 2023-12-28 ENCOUNTER — Encounter: Payer: Self-pay | Admitting: *Deleted

## 2023-12-30 ENCOUNTER — Encounter: Payer: Self-pay | Admitting: Cardiology

## 2023-12-30 ENCOUNTER — Ambulatory Visit: Attending: Cardiology | Admitting: Cardiology

## 2023-12-30 VITALS — BP 130/86 | HR 90 | Ht 64.0 in | Wt 179.0 lb

## 2023-12-30 DIAGNOSIS — I4729 Other ventricular tachycardia: Secondary | ICD-10-CM

## 2023-12-30 DIAGNOSIS — R030 Elevated blood-pressure reading, without diagnosis of hypertension: Secondary | ICD-10-CM

## 2023-12-30 DIAGNOSIS — Z3A34 34 weeks gestation of pregnancy: Secondary | ICD-10-CM

## 2023-12-30 NOTE — Progress Notes (Signed)
 [facility]io[facility]  New Evaluation  Date:  12/30/2023   ID:  Chelsea [name], [name] 1985-05-27, MRN [id number]  PCP:  Alben Therisa MATSU, PA   [facility] Providers Cardiologist:  Dub Huntsman, DO  Electrophysiologist:  None       Referring MD: Alben Therisa MATSU, [place]   Chief Complaint:    History of Present Illness:    Chelsea [name] is a 38 y.o. female [G3P1102] is here today for follow-up visit.  At her last visit we started low dose metoprolol .  She tells me that she has been having a tough time with the pregnancy.  Currently dilated and really has not had any rest.  Prior CV Studies Reviewed: The following studies were reviewed today: Echo reviewed   Past Medical History:  Diagnosis Date   Anxiety    Asthma    Atypical chest pain    Back pain    Bipolar affective disorder (HCC)    Complication of anesthesia    woke up during surgery   Depression    Hyperlipidemia    UTI (urinary tract infection)     Past Surgical History:  Procedure Laterality Date   NASAL SEPTUM SURGERY  10/2022   TONSILLECTOMY        OB History     Gravida  3   Para  2   Term  1   Preterm  1   AB      Living  2      SAB      IAB      Ectopic      Multiple  0   Live Births  2        Obstetric Comments  2012 PPROM             Current Medications: Current Meds  Medication Sig   acetaminophen  (TYLENOL ) 500 MG tablet Take 500-1,000 mg by mouth every 6 (six) hours as needed (for pain or headaches).   budesonide -formoterol  (SYMBICORT ) 80-4.5 MCG/ACT inhaler Inhale 2 puffs into the lungs 2 (two) times daily. Can use 2 puffs as needed every 4 hours for wheezing or shortness of breath.  Maximum 12 total puffs per day. Use spacer.   cyclobenzaprine  (FLEXERIL ) 10 MG tablet Take 10 mg by mouth as needed.   famotidine  (PEPCID ) 20 MG tablet Take 1 tablet (20 mg total) by mouth 2 (two) times daily.   fluticasone  (FLONASE ) 50 MCG/ACT nasal spray Place 2 sprays into both  nostrils daily.   metoCLOPramide  (REGLAN ) 10 MG tablet Take 1 tablet (10 mg total) by mouth every 8 (eight) hours as needed for refractory nausea / vomiting (nausea / vomiting refractory to zofran ).   metoprolol  succinate (TOPROL  XL) 25 MG 24 hr tablet Take 0.5 tablets (12.5 mg total) by mouth daily.   ondansetron  (ZOFRAN -ODT) 4 MG disintegrating tablet Take 1 tablet (4 mg total) by mouth every 8 (eight) hours.   pantoprazole  (PROTONIX ) 40 MG tablet Take 1 tablet (40 mg total) by mouth daily before breakfast.   Prenatal MV & Min w/FA-DHA (PRENATAL ADULT GUMMY/DHA/FA) 0.4-25 MG CHEW Chew 1 tablet by mouth.   promethazine  (PHENERGAN ) 25 MG suppository Place 1 suppository (25 mg total) rectally every 6 (six) hours as needed for nausea or vomiting. Can be placed vaginally instead of rectally.   QUEtiapine  (SEROQUEL ) 300 MG tablet Take 1 tablet (300 mg total) by mouth at bedtime.     Allergies:   Hydromet [hydrocodone  bit-homatrop mbr], Morphine  and codeine, and Penicillins  Social History   Socioeconomic History   Marital status: Married    Spouse name: Not on file   Number of children: Not on file   Years of education: Not on file   Highest education level: Not on file  Occupational History   Not on file  Tobacco Use   Smoking status: Never   Smokeless tobacco: Never  Vaping Use   Vaping status: Never Used  Substance and Sexual Activity   Alcohol use: Not Currently   Drug use: No   Sexual activity: Yes  Other Topics Concern   Not on file  Social History Narrative   Not on file   Social Drivers of Health   Financial Resource Strain: Not on file  Food Insecurity: No Food Insecurity (07/09/2023)   Hunger Vital Sign    Worried About Running Out of Food in the Last Year: Never true    Ran Out of Food in the Last Year: Never true  Transportation Needs: No Transportation Needs (07/09/2023)   PRAPARE - Administrator, Civil Service (Medical): No    Lack of Transportation  (Non-Medical): No  Physical Activity: Not on file  Stress: Not on file  Social Connections: Not on file      Family History  Problem Relation Age of Onset   Hypertension Mother    Hypertension Father    High Cholesterol Father    Heart block Father        partial   Heart attack Paternal Grandfather       ROS:   Please see the history of present illness.    Palpitations All other systems reviewed and are negative.   Labs/EKG Reviewed:    EKG:   None today   Recent Labs: 10/05/2023: Magnesium 2.0 11/10/2023: ALT 15; BUN <5; Creatinine, Ser 0.51; Hemoglobin 12.7; Platelets 243; Potassium 3.2; Sodium 136   Recent Lipid Panel No results found for: CHOL, TRIG, HDL, CHOLHDL, LDLCALC, LDLDIRECT  Physical Exam:    VS:  BP 130/86 (BP Location: Left Arm, Patient Position: Sitting, Cuff Size: Normal)   Pulse 90   Ht 5' 4 (1.626 m)   Wt 179 lb (81.2 kg)   LMP  (LMP Unknown) Comment: irreg cycles, wasn't tracking  SpO2 98%   BMI 30.73 kg/m     Wt Readings from Last 3 Encounters:  12/30/23 179 lb (81.2 kg)  12/16/23 178 lb (80.7 kg)  11/10/23 179 lb 1.6 oz (81.2 kg)     GEN:  Well nourished, well developed in no acute distress HEENT: Normal NECK: No JVD; No carotid bruits LYMPHATICS: No lymphadenopathy CARDIAC: RRR, no murmurs, rubs, gallops RESPIRATORY:  Clear to auscultation without rales, wheezing or rhonchi  ABDOMEN: Soft, non-tender, non-distended MUSCULOSKELETAL:  No edema; No deformity  SKIN: Warm and dry NEUROLOGIC:  Alert and oriented x 3 PSYCHIATRIC:  Normal affect    Risk Assessment/Risk Calculators:                  ASSESSMENT & PLAN:    NSVT  Second Degree AV Block-Mobitz I (Wenckebach)  Elevated blood pressure  She is tolerating metoprolol , she tells me this is helping with the symptoms she does not feel palpitations nearly as much. We have to monitor her blood pressure closely.  Today in the office she is 130/86 mmHg.  She  tells me she has a blood pressure at home we will try to locate it is we can be able to take blood pressures and  send me updates on Monday or Tuesday.  If she does not find that she [facility] prescription for blood pressure cuff for her.  Will see the patient in 6 weeks   Patient Instructions  Medication Instructions:  Your physician recommends that you continue on your current medications as directed. Please refer to the Current Medication list given to you today.  *If you need a refill on your cardiac medications before your next appointment, please call your pharmacy*  Follow-Up: At [facility], you and your health needs are our priority.  As part of our continuing mission to provide you with exceptional heart care, our providers are all part of one team.  This team includes your primary Cardiologist (physician) and Advanced Practice Providers or APPs (Physician Assistants and Nurse Practitioners) who all work together to provide you with the care you need, when you need it.  Your next appointment:   6 week(s)  Provider:   Manha Amato, DO    Other Instructions Please take your blood pressure on Monday and send in a MyChart message. Please include heart rates.   HOW TO TAKE YOUR BLOOD PRESSURE: Rest 5 minutes before taking your blood pressure. Don't smoke or drink caffeinated beverages for at least 30 minutes before. Take your blood pressure before (not after) you eat. Sit comfortably with your back supported and both feet on the floor (don't cross your legs). Elevate your arm to heart level on a table or a desk. Use the proper sized cuff. It should fit smoothly and snugly around your bare upper arm. There should be enough room to slip a fingertip under the cuff. The bottom edge of the cuff should be 1 inch above the crease of the elbow. Ideally, take 3 measurements at one sitting and record the average.    Dispo:  No follow-ups on file.    Medication Adjustments/Labs and Tests Ordered: Current medicines are reviewed at length with the patient today.  Concerns regarding medicines are outlined above.  Tests Ordered: No orders of the defined types were placed in this encounter.  Medication Changes: No orders of the defined types were placed in this encounter.

## 2023-12-30 NOTE — Patient Instructions (Signed)
 Medication Instructions:  Your physician recommends that you continue on your current medications as directed. Please refer to the Current Medication list given to you today.  *If you need a refill on your cardiac medications before your next appointment, please call your pharmacy*  Follow-Up: At [facility], you and your health needs are our priority.  As part of our continuing mission to provide you with exceptional heart care, our providers are all part of one team.  This team includes your primary Cardiologist (physician) and Advanced Practice Providers or APPs (Physician Assistants and Nurse Practitioners) who all work together to provide you with the care you need, when you need it.  Your next appointment:   6 week(s)  Provider:   Kardie Tobb, DO    Other Instructions Please take your blood pressure on Monday and send in a MyChart message. Please include heart rates.   HOW TO TAKE YOUR BLOOD PRESSURE: Rest 5 minutes before taking your blood pressure. Don't smoke or drink caffeinated beverages for at least 30 minutes before. Take your blood pressure before (not after) you eat. Sit comfortably with your back supported and both feet on the floor (don't cross your legs). Elevate your arm to heart level on a table or a desk. Use the proper sized cuff. It should fit smoothly and snugly around your bare upper arm. There should be enough room to slip a fingertip under the cuff. The bottom edge of the cuff should be 1 inch above the crease of the elbow. Ideally, take 3 measurements at one sitting and record the average.

## 2024-01-14 ENCOUNTER — Other Ambulatory Visit (HOSPITAL_BASED_OUTPATIENT_CLINIC_OR_DEPARTMENT_OTHER): Payer: Self-pay

## 2024-01-14 MED ORDER — OSELTAMIVIR PHOSPHATE 75 MG PO CAPS
75.0000 mg | ORAL_CAPSULE | Freq: Two times a day (BID) | ORAL | 0 refills | Status: DC
  Filled 2024-01-14: qty 10, 5d supply, fill #0

## 2024-01-27 NOTE — L&D Delivery Note (Signed)
 Delivery Note Pt complained of severe pelvic pressure and pain while in bathroom. She was led back to bed and checked and noted to be 5-6cm dilated. She received her epidural but continued to complain of severe vaginal pain and uncontrollably started on push.  On exam, vertex now crowning.  She pushed 2-3 times with her legs in McRoberts position due to bed not broken down ( no time) and at 6:31 AM a viable female was delivered via Vaginal, Spontaneous (Presentation: LOA   ).  APGAR: 6,9 ; weight   Nuchal x 1 was reduced over infants head at perineum.   Anterior and posterior shoulders were delivered with next push and body quickly followed.  Infant was placed on mothers abdomen and cord clamped and cut after a minute delay. Cord blood obtained. .   Placenta status: delivered, schultz, intact  ,  .  Cord: 3vc  with the following complications: none .  Cord pH: n/a  Anesthesia:  Spinal epidural  Episiotomy:  none Lacerations: none   Suture Repair: n/a Est. Blood Loss (mL): 135   Mom to postpartum.  Baby to Couplet care / Skin to Skin.  Ted LELON Solo 01/29/2024, 6:53 AM

## 2024-01-28 ENCOUNTER — Encounter (HOSPITAL_COMMUNITY): Payer: Self-pay | Admitting: Obstetrics and Gynecology

## 2024-01-28 ENCOUNTER — Inpatient Hospital Stay (HOSPITAL_COMMUNITY)
Admission: AD | Admit: 2024-01-28 | Discharge: 2024-01-31 | DRG: 806 | Disposition: A | Discharge status: Home / Self Care | Attending: Obstetrics and Gynecology | Admitting: Obstetrics and Gynecology

## 2024-01-28 ENCOUNTER — Other Ambulatory Visit: Payer: Self-pay

## 2024-01-28 DIAGNOSIS — O99344 Other mental disorders complicating childbirth: Secondary | ICD-10-CM | POA: Diagnosis present

## 2024-01-28 DIAGNOSIS — F4310 Post-traumatic stress disorder, unspecified: Secondary | ICD-10-CM

## 2024-01-28 DIAGNOSIS — O3413 Maternal care for benign tumor of corpus uteri, third trimester: Secondary | ICD-10-CM | POA: Diagnosis present

## 2024-01-28 DIAGNOSIS — O139 Gestational [pregnancy-induced] hypertension without significant proteinuria, unspecified trimester: Secondary | ICD-10-CM | POA: Diagnosis present

## 2024-01-28 DIAGNOSIS — D259 Leiomyoma of uterus, unspecified: Secondary | ICD-10-CM | POA: Diagnosis present

## 2024-01-28 DIAGNOSIS — F319 Bipolar disorder, unspecified: Secondary | ICD-10-CM | POA: Diagnosis present

## 2024-01-28 DIAGNOSIS — O99824 Streptococcus B carrier state complicating childbirth: Secondary | ICD-10-CM | POA: Diagnosis present

## 2024-01-28 DIAGNOSIS — Z8249 Family history of ischemic heart disease and other diseases of the circulatory system: Secondary | ICD-10-CM | POA: Diagnosis not present

## 2024-01-28 DIAGNOSIS — Z3A38 38 weeks gestation of pregnancy: Secondary | ICD-10-CM | POA: Diagnosis not present

## 2024-01-28 DIAGNOSIS — O1493 Unspecified pre-eclampsia, third trimester: Principal | ICD-10-CM

## 2024-01-28 DIAGNOSIS — O26893 Other specified pregnancy related conditions, third trimester: Secondary | ICD-10-CM | POA: Diagnosis present

## 2024-01-28 DIAGNOSIS — Z6791 Unspecified blood type, Rh negative: Secondary | ICD-10-CM

## 2024-01-28 DIAGNOSIS — I472 Ventricular tachycardia, unspecified: Secondary | ICD-10-CM | POA: Diagnosis present

## 2024-01-28 DIAGNOSIS — M545 Low back pain, unspecified: Secondary | ICD-10-CM | POA: Diagnosis not present

## 2024-01-28 DIAGNOSIS — R030 Elevated blood-pressure reading, without diagnosis of hypertension: Secondary | ICD-10-CM | POA: Diagnosis present

## 2024-01-28 DIAGNOSIS — F410 Panic disorder [episodic paroxysmal anxiety] without agoraphobia: Secondary | ICD-10-CM | POA: Diagnosis present

## 2024-01-28 DIAGNOSIS — F419 Anxiety disorder, unspecified: Secondary | ICD-10-CM

## 2024-01-28 DIAGNOSIS — Z79899 Other long term (current) drug therapy: Secondary | ICD-10-CM | POA: Diagnosis not present

## 2024-01-28 DIAGNOSIS — O9942 Diseases of the circulatory system complicating childbirth: Secondary | ICD-10-CM | POA: Diagnosis present

## 2024-01-28 DIAGNOSIS — R002 Palpitations: Secondary | ICD-10-CM | POA: Diagnosis present

## 2024-01-28 DIAGNOSIS — O133 Gestational [pregnancy-induced] hypertension without significant proteinuria, third trimester: Secondary | ICD-10-CM | POA: Diagnosis present

## 2024-01-28 LAB — CBC
HCT: 35.7 % — ABNORMAL LOW (ref 36.0–46.0)
HCT: 37.7 % (ref 36.0–46.0)
Hemoglobin: 12.2 g/dL (ref 12.0–15.0)
Hemoglobin: 12.8 g/dL (ref 12.0–15.0)
MCH: 29.3 pg (ref 26.0–34.0)
MCH: 29.3 pg (ref 26.0–34.0)
MCHC: 34.2 g/dL (ref 30.0–36.0)
MCHC: 34 g/dL (ref 30.0–36.0)
MCV: 85.8 fL (ref 80.0–100.0)
MCV: 86.3 fL (ref 80.0–100.0)
Platelets: 275 K/uL (ref 150–400)
Platelets: 276 K/uL (ref 150–400)
RBC: 4.16 MIL/uL (ref 3.87–5.11)
RBC: 4.37 MIL/uL (ref 3.87–5.11)
RDW: 15 % (ref 11.5–15.5)
RDW: 15 % (ref 11.5–15.5)
WBC: 12.3 K/uL — ABNORMAL HIGH (ref 4.0–10.5)
WBC: 12.4 K/uL — ABNORMAL HIGH (ref 4.0–10.5)
nRBC: 0 % (ref 0.0–0.2)
nRBC: 0 % (ref 0.0–0.2)

## 2024-01-28 LAB — COMPREHENSIVE METABOLIC PANEL WITH GFR
ALT: 8 U/L (ref 0–44)
AST: 16 U/L (ref 15–41)
Albumin: 3.7 g/dL (ref 3.5–5.0)
Alkaline Phosphatase: 138 U/L — ABNORMAL HIGH (ref 38–126)
Anion gap: 11 (ref 5–15)
BUN: <5 mg/dL — ABNORMAL LOW (ref 6–20)
CO2: 20 mmol/L — ABNORMAL LOW (ref 22–32)
Calcium: 9.4 mg/dL (ref 8.9–10.3)
Chloride: 106 mmol/L (ref 98–111)
Creatinine, Ser: 0.63 mg/dL (ref 0.44–1.00)
GFR, Estimated: >60 mL/min
Glucose, Bld: 92 mg/dL (ref 70–99)
Potassium: 4.5 mmol/L (ref 3.5–5.1)
Sodium: 137 mmol/L (ref 135–145)
Total Bilirubin: 0.4 mg/dL (ref 0.0–1.2)
Total Protein: 6.5 g/dL (ref 6.5–8.1)

## 2024-01-28 LAB — URINALYSIS, ROUTINE W REFLEX MICROSCOPIC
Bilirubin Urine: NEGATIVE
Glucose, UA: NEGATIVE mg/dL
Hgb urine dipstick: NEGATIVE
Ketones, ur: NEGATIVE mg/dL
Leukocytes,Ua: NEGATIVE
Nitrite: NEGATIVE
Protein, ur: NEGATIVE mg/dL
Specific Gravity, Urine: 1.018 (ref 1.005–1.030)
pH: 6 (ref 5.0–8.0)

## 2024-01-28 LAB — TYPE AND SCREEN
ABO/RH(D): B NEG
Antibody Screen: POSITIVE

## 2024-01-28 LAB — PROTEIN / CREATININE RATIO, URINE
Creatinine, Urine: 164 mg/dL
Protein Creatinine Ratio: 0.1 mg/mg
Total Protein, Urine: 19 mg/dL

## 2024-01-28 LAB — PRO BRAIN NATRIURETIC PEPTIDE: Pro Brain Natriuretic Peptide: <50 pg/mL

## 2024-01-28 MED ORDER — LACTATED RINGERS IV SOLN: 500.0000 mL | INTRAVENOUS | Status: DC | PRN

## 2024-01-28 MED ORDER — CEFAZOLIN SODIUM-DEXTROSE 1-4 GM/50ML-% IV SOLN
1.0000 g | Freq: Three times a day (TID) | INTRAVENOUS | Status: DC
  Filled 2024-01-28: qty 50

## 2024-01-28 MED ORDER — LIDOCAINE HCL (PF) 1 % IJ SOLN: 30.0000 mL | INTRAMUSCULAR | Status: DC | PRN

## 2024-01-28 MED ORDER — ONDANSETRON HCL 4 MG/2ML IJ SOLN: 4.0000 mg | Freq: Four times a day (QID) | INTRAMUSCULAR | Status: DC | PRN

## 2024-01-28 MED ORDER — MISOPROSTOL 50MCG HALF TABLET: 50.0000 ug | ORAL_TABLET | ORAL | Status: DC | PRN

## 2024-01-28 MED ORDER — SOD CITRATE-CITRIC ACID 500-334 MG/5ML PO SOLN: 30.0000 mL | ORAL | Status: DC | PRN

## 2024-01-28 MED ORDER — LABETALOL HCL 5 MG/ML IV SOLN: 80.0000 mg | INTRAVENOUS | Status: DC | PRN

## 2024-01-28 MED ORDER — OXYTOCIN-SODIUM CHLORIDE 30-0.9 UT/500ML-% IV SOLN
1.0000 m[IU]/min | INTRAVENOUS | Status: DC
  Administered 2024-01-29: 2 m[IU]/min via INTRAVENOUS
  Filled 2024-01-28: qty 500

## 2024-01-28 MED ORDER — TERBUTALINE SULFATE 1 MG/ML IJ SOLN: 0.2500 mg | Freq: Once | INTRAMUSCULAR | Status: DC | PRN

## 2024-01-28 MED ORDER — PHENYLEPHRINE 80 MCG/ML (10ML) SYRINGE FOR IV PUSH (FOR BLOOD PRESSURE SUPPORT): 80.0000 ug | PREFILLED_SYRINGE | INTRAVENOUS | Status: DC | PRN

## 2024-01-28 MED ORDER — OXYCODONE-ACETAMINOPHEN 5-325 MG PO TABS: 2.0000 | ORAL_TABLET | ORAL | Status: DC | PRN

## 2024-01-28 MED ORDER — QUETIAPINE FUMARATE 300 MG PO TABS
300.0000 mg | ORAL_TABLET | Freq: Every day | ORAL | Status: DC
  Administered 2024-01-28: 300 mg via ORAL
  Filled 2024-01-28: qty 1

## 2024-01-28 MED ORDER — HYDROXYZINE HCL 50 MG PO TABS: 50.0000 mg | ORAL_TABLET | Freq: Four times a day (QID) | ORAL | Status: DC | PRN

## 2024-01-28 MED ORDER — OXYTOCIN BOLUS FROM INFUSION: 333.0000 mL | Freq: Once | INTRAVENOUS | Status: DC

## 2024-01-28 MED ORDER — EPHEDRINE 5 MG/ML INJ: 10.0000 mg | INTRAVENOUS | Status: DC | PRN

## 2024-01-28 MED ORDER — MISOPROSTOL 25 MCG QUARTER TABLET
25.0000 ug | ORAL_TABLET | ORAL | Status: DC | PRN
  Administered 2024-01-28: 25 ug via ORAL
  Filled 2024-01-28: qty 1

## 2024-01-28 MED ORDER — LACTATED RINGERS IV SOLN: INTRAVENOUS | Status: DC

## 2024-01-28 MED ORDER — LABETALOL HCL 5 MG/ML IV SOLN: 40.0000 mg | INTRAVENOUS | Status: DC | PRN

## 2024-01-28 MED ORDER — DIPHENHYDRAMINE HCL 50 MG/ML IJ SOLN
12.5000 mg | INTRAMUSCULAR | Status: DC | PRN
  Administered 2024-01-29: 12.5 mg via INTRAVENOUS
  Filled 2024-01-28: qty 1

## 2024-01-28 MED ORDER — LACTATED RINGERS IV SOLN
500.0000 mL | Freq: Once | INTRAVENOUS | Status: AC
  Administered 2024-01-29: 500 mL via INTRAVENOUS

## 2024-01-28 MED ORDER — FENTANYL CITRATE (PF) 100 MCG/2ML IJ SOLN
50.0000 ug | INTRAMUSCULAR | Status: DC | PRN
  Administered 2024-01-29 (×2): 100 ug via INTRAVENOUS
  Filled 2024-01-28 (×3): qty 2

## 2024-01-28 MED ORDER — CEFAZOLIN SODIUM-DEXTROSE 2-4 GM/100ML-% IV SOLN
2.0000 g | Freq: Once | INTRAVENOUS | Status: AC
  Administered 2024-01-28: 2 g via INTRAVENOUS
  Filled 2024-01-28: qty 100

## 2024-01-28 MED ORDER — OXYTOCIN-SODIUM CHLORIDE 30-0.9 UT/500ML-% IV SOLN: 2.5000 [IU]/h | INTRAVENOUS | Status: DC

## 2024-01-28 MED ORDER — OXYCODONE-ACETAMINOPHEN 5-325 MG PO TABS: 1.0000 | ORAL_TABLET | ORAL | Status: DC | PRN

## 2024-01-28 MED ORDER — METOPROLOL SUCCINATE 12.5 MG HALF TABLET
12.5000 mg | ORAL_TABLET | Freq: Every day | ORAL | Status: DC
  Filled 2024-01-28: qty 1

## 2024-01-28 MED ORDER — HYDRALAZINE HCL 20 MG/ML IJ SOLN: 10.0000 mg | INTRAMUSCULAR | Status: DC | PRN

## 2024-01-28 MED ORDER — FLUTICASONE FUROATE-VILANTEROL 100-25 MCG/ACT IN AEPB
1.0000 | INHALATION_SPRAY | Freq: Every day | RESPIRATORY_TRACT | Status: DC
  Administered 2024-01-29: 1 via RESPIRATORY_TRACT
  Filled 2024-01-28: qty 28

## 2024-01-28 MED ORDER — FENTANYL-BUPIVACAINE-NACL 0.5-0.125-0.9 MG/250ML-% EP SOLN
12.0000 mL/h | EPIDURAL | Status: DC | PRN
  Filled 2024-01-28: qty 250

## 2024-01-28 MED ORDER — LABETALOL HCL 5 MG/ML IV SOLN: 20.0000 mg | INTRAVENOUS | Status: DC | PRN

## 2024-01-28 MED ORDER — CYCLOBENZAPRINE HCL 10 MG PO TABS
10.0000 mg | ORAL_TABLET | Freq: Once | ORAL | Status: AC
  Administered 2024-01-28: 10 mg via ORAL
  Filled 2024-01-28: qty 1

## 2024-01-28 MED ORDER — ACETAMINOPHEN 325 MG PO TABS
650.0000 mg | ORAL_TABLET | ORAL | Status: DC | PRN
  Administered 2024-01-29: 650 mg via ORAL
  Filled 2024-01-28: qty 2

## 2024-01-28 NOTE — H&P (Signed)
 Chelsea [name] is a 75 y.n.[id number]  female presenting for IOL due to presentation in MAU with new onset of elevated BP associated with a HA. PreE labs were in normal range however given persistence of HTN ( over three hours) decision made to admit for IOL Pt also presented with complaint of .feeling out of it  and lightheaded.  Pt was started on metoprolol  for SVT by cardio OB Dr Sheena a few weeks ago.  Her pregnancy has also been complicated by   -Bipolar disorder - on seroquel  - History of asthma - on proAir  - History of preterm delivery ( 35wks) - AMA - Rh neg - got rhogam at 28wks - Pt complaint of SOB after epidural with last pregnancy She is GBS pos. Neg NIPT    Past Medical History:  Diagnosis Date   Anxiety    Asthma    Atypical chest pain    Back pain    Bipolar affective disorder (HCC)    Complication of anesthesia    woke up during surgery   Depression    Hyperlipidemia    UTI (urinary tract infection)    Past Surgical History:  Procedure Laterality Date   NASAL SEPTUM SURGERY  10/2022   TONSILLECTOMY     Family History: family history includes Heart attack in her paternal grandfather; Heart block in her father; High Cholesterol in her father; Hypertension in her father and mother. Social History:  reports that she has never smoked. She has never used smokeless tobacco. She reports that she does not currently use alcohol. She reports that she does not use drugs.     Maternal Diabetes: No Genetic Screening: Normal Maternal Ultrasounds/Referrals: Normal Fetal Ultrasounds or other Referrals:  None Maternal Substance Abuse:  No Significant Maternal Medications:  Meds include: Other: see HPI Significant Maternal Lab Results:  Group B Strep positive Number of Prenatal Visits:greater than 3 verified prenatal visits Maternal Vaccinations:TDap and Flu Other Comments:  None  Review of Systems  Constitutional:  Positive for activity change and fatigue.  Eyes:  Positive  for visual disturbance.  Respiratory:  Positive for chest tightness. Negative for shortness of breath.   Cardiovascular:  Positive for chest pain and palpitations. Negative for leg swelling.  Gastrointestinal:  Positive for abdominal pain.  Genitourinary:  Positive for pelvic pain.  Musculoskeletal:  Negative for back pain.  Neurological:  Positive for headaches. Negative for light-headedness.  Psychiatric/Behavioral:  The patient is nervous/anxious.    Maternal Medical History:  Reason for admission: New onset elevated BP with HA   Contractions: Frequency: rare.   Perceived severity is mild.   Fetal activity: Perceived fetal activity is normal.   Prenatal complications: no prenatal complications Prenatal Complications - Diabetes: none.   Dilation: 2.5 Effacement (%): 70 Station: -2 Exam by:: Nathanel Tanda PEAK Blood pressure 123/78, pulse 91, temperature 98.5 F (36.9 C), resp. rate 18, SpO2 100%, unknown if currently breastfeeding. Maternal Exam:  Uterine Assessment: Contraction strength is mild.  Contraction frequency is rare.  Abdomen: Patient reports generalized tenderness.  Estimated fetal weight is AGA.   Fetal presentation: vertex Introitus: Normal vulva. Vulva is negative for condylomata and lesion.  Normal vagina.  Vagina is negative for condylomata.  Pelvis: adequate for delivery.   Cervix: Cervix evaluated by digital exam.     Fetal Exam Fetal Monitor Review: Baseline rate: 140.  Variability: moderate (6-25 bpm).   Pattern: accelerations present and no decelerations.   Fetal State Assessment: Category I - tracings are normal.  Physical Exam Vitals and nursing note reviewed.  Constitutional:      Appearance: She is well-developed and normal weight.  Pulmonary:     Effort: Pulmonary effort is normal.  Abdominal:     Tenderness: There is generalized abdominal tenderness.  Genitourinary:    General: Normal vulva.  Vulva is no lesion.  Neurological:      General: No focal deficit present.  Psychiatric:        Mood and Affect: Mood normal.        Behavior: Behavior normal.     Prenatal labs: ABO, Rh: --/--/B NEG (06/12 1244) Antibody: NEG (06/12 1244) Rubella:   RPR:    HBsAg:    HIV:    GBS:     Assessment/Plan: 63bn [id number] female at 54 1/7wks with HTN - new onset and HA for IOL  - Admit  - Cytotec for ripening; IP foley when able or AROM /Pit as indicated - Pain control prn - BP in normal range while waiting on antepartum unit for bed on L/D; will continue to monitor. Neg preE labs  - GBS pos - will treat with PCN - Anticipate svd    Ted ORN Melani Brisbane 01/28/2024, 8:58 PM

## 2024-01-28 NOTE — MAU Provider Note (Signed)
 " History     CSN: [id number]  Arrival date and time: 01/28/24 1438   Event Date/Time   First Provider Initiated Contact with Patient 01/28/24 1652      Chief Complaint  Patient presents with   Dizziness   Chest Pain   Decreased Fetal Movement   HPI  Chelsea [name] is a 39 y.o. female [phone] @ [redacted]w[redacted]d here in MAU with complaints of SOB, reports flushed and hot in her face along with dizziness. She reports chest pain that started yesterday. She reports that her HR was pounding very fast and it felt like she was running a marathon. This is not a new symptom, but seems to have worsening symptoms. No radiating chest pain. Just overall feels exhausted.   She reports new onset vision changes that stared last night. The spots were black and the size of dinner plates. The vision changes continued this morning and throughout today. No Ha.   Home BP's 146/95, 147/90, 156/100, 138/95, 143/100: log provided today for review.   She is on metoprolol  for tachycardia/ palpitations and NSVT. She is currently taking Toprolol 12.5 mg at nighttime. No known history of chronic HTN. She is seeing Dr. Sheena.   OB History     Gravida  3   Para  2   Term  1   Preterm  1   AB      Living  2      SAB      IAB      Ectopic      Multiple  0   Live Births  2        Obstetric Comments  2012 PPROM         Past Medical History:  Diagnosis Date   Anxiety    Asthma    Atypical chest pain    Back pain    Bipolar affective disorder (HCC)    Complication of anesthesia    woke up during surgery   Depression    Hyperlipidemia    UTI (urinary tract infection)     Past Surgical History:  Procedure Laterality Date   NASAL SEPTUM SURGERY  10/2022   TONSILLECTOMY      Family History  Problem Relation Age of Onset   Hypertension Mother    Hypertension Father    High Cholesterol Father    Heart block Father        partial   Heart attack Paternal Grandfather     Social  History[1]  Allergies: Allergies[2]  Medications Prior to Admission  Medication Sig Dispense Refill Last Dose/Taking   metoprolol  succinate (TOPROL  XL) 25 MG 24 hr tablet Take 0.5 tablets (12.5 mg total) by mouth daily. 15 tablet 3 01/28/2024   Prenatal MV & Min w/FA-DHA (PRENATAL ADULT GUMMY/DHA/FA) 0.4-25 MG CHEW Chew 1 tablet by mouth.   Past Week   QUEtiapine  (SEROQUEL ) 300 MG tablet Take 1 tablet (300 mg total) by mouth at bedtime. 30 tablet 0 01/27/2024   acetaminophen  (TYLENOL ) 500 MG tablet Take 500-1,000 mg by mouth every 6 (six) hours as needed (for pain or headaches).      budesonide -formoterol  (SYMBICORT ) 80-4.5 MCG/ACT inhaler Inhale 2 puffs into the lungs 2 (two) times daily. Can use 2 puffs as needed every 4 hours for wheezing or shortness of breath.  Maximum 12 total puffs per day. Use spacer. 1 each 0    cyclobenzaprine  (FLEXERIL ) 10 MG tablet Take 10 mg by mouth as needed.  famotidine  (PEPCID ) 20 MG tablet Take 1 tablet (20 mg total) by mouth 2 (two) times daily. 60 tablet 0    fluticasone  (FLONASE ) 50 MCG/ACT nasal spray Place 2 sprays into both nostrils daily. 9.9 mL 0    metoCLOPramide  (REGLAN ) 10 MG tablet Take 1 tablet (10 mg total) by mouth every 8 (eight) hours as needed for refractory nausea / vomiting (nausea / vomiting refractory to zofran ). 90 tablet 0    ondansetron  (ZOFRAN -ODT) 4 MG disintegrating tablet Take 1 tablet (4 mg total) by mouth every 8 (eight) hours. 90 tablet 0    oseltamivir  (TAMIFLU ) 75 MG capsule Take 1 capsule (75 mg total) by mouth 2 (two) times daily. 10 capsule 0    pantoprazole  (PROTONIX ) 40 MG tablet Take 1 tablet (40 mg total) by mouth daily before breakfast. 30 tablet 0    promethazine  (PHENERGAN ) 25 MG suppository Place 1 suppository (25 mg total) rectally every 6 (six) hours as needed for nausea or vomiting. Can be placed vaginally instead of rectally. 12 suppository 0    Results for orders placed or performed during the hospital encounter  of 01/28/24 (from the past 48 hours)  Urinalysis, Routine w reflex microscopic -Urine, Clean Catch     Status: Abnormal   Collection Time: 01/28/24  3:59 PM  Result Value Ref Range   Color, Urine YELLOW YELLOW   APPearance CLOUDY (A) CLEAR   Specific Gravity, Urine 1.018 1.005 - 1.030   pH 6.0 5.0 - 8.0   Glucose, UA NEGATIVE NEGATIVE mg/dL   Hgb urine dipstick NEGATIVE NEGATIVE   Bilirubin Urine NEGATIVE NEGATIVE   Ketones, ur NEGATIVE NEGATIVE mg/dL   Protein, ur NEGATIVE NEGATIVE mg/dL   Nitrite NEGATIVE NEGATIVE   Leukocytes,Ua NEGATIVE NEGATIVE    Comment: Performed at [facility], [address] [place], [place] [address]  Protein / creatinine ratio, urine     Status: None   Collection Time: 01/28/24  3:59 PM  Result Value Ref Range   Creatinine, Urine 164 mg/dL    Comment: NO NORMAL RANGE ESTABLISHED FOR THIS TEST   Total Protein, Urine 19 mg/dL    Comment: NO NORMAL RANGE ESTABLISHED FOR THIS TEST   Protein Creatinine Ratio 0.1 <0.2 mg/mg    Comment: Please note change in reference range. Performed at [facility], [address], [place], [place] [address]   CBC     Status: Abnormal   Collection Time: 01/28/24  4:21 PM  Result Value Ref Range   WBC 12.3 (H) 4.0 - 10.5 K/uL   RBC 4.16 3.87 - 5.11 MIL/uL   Hemoglobin 12.2 12.0 - 15.0 g/dL   HCT 64.2 (L) 63.9 - 53.9 %   MCV 85.8 80.0 - 100.0 fL   MCH 29.3 26.0 - 34.0 pg   MCHC 34.2 30.0 - 36.0 g/dL   RDW 84.9 88.4 - 84.4 %   Platelets 275 150 - 400 K/uL   nRBC 0.0 0.0 - 0.2 %    Comment: Performed at [facility], [address] [place], [place] [address]  Comprehensive metabolic panel     Status: Abnormal   Collection Time: 01/28/24  4:21 PM  Result Value Ref Range   Sodium 137 135 - 145 mmol/L   Potassium 4.5 3.5 - 5.1 mmol/L   Chloride 106 98 - 111 mmol/L   CO2 20 (L) 22 - 32 mmol/L   Glucose, Bld 92 70 - 99 mg/dL    Comment: Glucose reference  range applies only to samples taken after  fasting for at least 8 hours.   BUN <5 (L) 6 - 20 mg/dL   Creatinine, Ser 9.36 0.44 - 1.00 mg/dL   Calcium  9.4 8.9 - 10.3 mg/dL   Total Protein 6.5 6.5 - 8.1 g/dL   Albumin 3.7 3.5 - 5.0 g/dL   AST 16 15 - 41 U/L   ALT 8 0 - 44 U/L   Alkaline Phosphatase 138 (H) 38 - 126 U/L   Total Bilirubin 0.4 0.0 - 1.2 mg/dL   GFR, Estimated >39 >39 mL/min    Comment: (NOTE) Calculated using the CKD-EPI Creatinine Equation (2021)    Anion gap 11 5 - 15    Comment: Performed at [facility], [address] [place], [place] [address]  Pro Brain natriuretic peptide     Status: None   Collection Time: 01/28/24  5:21 PM  Result Value Ref Range   Pro Brain Natriuretic Peptide <50.0 <300.0 pg/mL    Comment: (NOTE) Age Group        Cut-Points    Interpretation  < 50 years     450 pg/mL       NT-proBNP > 450 pg/mL indicates                                ADHF is likely              50 to 75 years  900 pg/mL      NT-proBNP > 900 pg/mL indicates          ADHF is likely  > 75 years      1800 pg/mL     NT-proBNP > 1800 pg/mL indicates          ADHF is likely                           All ages    Results between       Indeterminate. Further clinical             300 and the cut-   information is needed to determine            point for age group   if ADHF is present.                                                             Elecsys proBNP II/ Elecsys proBNP II STAT           Cut-Point                       Interpretation  300 pg/mL                    NT-proBNP <300pg/mL indicates                             ADHF is not likely  Performed at [facility], [address], [place], [place] [address]      Review of Systems  HENT:  Negative for congestion.   Eyes:  Positive for visual disturbance.  Respiratory:  Positive for shortness of breath. Negative for apnea, choking, chest tightness, wheezing and stridor.   Neurological:  Negative for headaches.   Physical Exam   Blood  pressure (!) 135/92, pulse 92, temperature 98.5 F (36.9 C), resp. rate 18, SpO2 100%, unknown if currently breastfeeding.  Patient Vitals for the past 24 hrs:  BP Temp Pulse Resp SpO2  01/28/24 2030 123/78 -- 91 -- --  01/28/24 2000 119/77 -- 92 -- --  01/28/24 1935 118/75 -- 91 -- --  01/28/24 1815 (!) 138/92 -- 91 -- 100 %  01/28/24 1800 139/87 -- 98 -- 100 %  01/28/24 1745 (!) 144/97 -- 99 -- --  01/28/24 1742 (!) 136/92 -- (!) 102 -- --  01/28/24 1741 (!) 142/97 -- 96 -- --  01/28/24 1715 (!) 135/97 -- 99 -- 100 %  01/28/24 1700 (!) 145/96 -- 95 -- 99 %  01/28/24 1651 (!) 135/92 -- 92 -- --  01/28/24 1604 (!) 139/99 -- 96 -- --  01/28/24 1555 -- -- -- -- 100 %  01/28/24 1552 (!) 141/96 98.5 F (36.9 C) (!) 108 18 --     Physical Exam Constitutional:      Appearance: Normal appearance. She is obese. She is ill-appearing.  HENT:     Head: Normocephalic.  Musculoskeletal:        General: Normal range of motion.  Skin:    General: Skin is warm.  Neurological:     Mental Status: She is alert and oriented to person, place, and time.     Deep Tendon Reflexes: Reflexes normal.     Comments: Negative clonus     MAU Course  Procedures  MDM  NST reactive, category 1 PIH labs reassuring Flexeril  given for back pain per request from patient BP's elevated with new onset vision changes at term,  discussed symptoms with on-call Dr. Delana who agrees with admission for new onset elevated BP and new onset vision changes. EKG normal sinus rhythm Orthostatic vitals difficult to complete due to tachycardia.  BNP normal.  Assessment and Plan   A:  1. Pre-eclampsia in third trimester   2. [redacted] weeks gestation of pregnancy      P:  Care turned over to MD.  Dorita Delon FERNS, NP 01/28/2024 9:05 PM     [1]  Social History Tobacco Use   Smoking status: Never   Smokeless tobacco: Never  Vaping Use   Vaping status: Never Used  Substance Use Topics   Alcohol use: Not  Currently   Drug use: No  [2]  Allergies Allergen Reactions   Hydromet [Hydrocodone  Bit-Homatrop Mbr] Anaphylaxis, Itching and Swelling    Patient states that her throat closes   Morphine  And Codeine Itching and Other (See Comments)    Makes entire burn and itch SEVERELY; clawed her skin   Penicillins Palpitations    Has patient had a PCN reaction causing immediate rash, facial/tongue/throat swelling, SOB or lightheadedness with hypotension: Yes Has patient had a PCN reaction causing severe rash involving mucus membranes or skin necrosis: No Has patient had a PCN reaction that required hospitalization: No Has patient had a PCN reaction occurring within the last 10 years: Yes If all of the above answers are NO, then may proceed with Cephalosporin use.    "

## 2024-01-28 NOTE — MAU Note (Signed)
 Chelsea [name] is a 39 y.o. at [redacted]w[redacted]d here in MAU reporting: has been feeling out of it  tired and light headed . Has had some elevated b/p at home 140's/90's. On metoprolol  for b/p started a few weeks ago.  Stated has not felt baby move much today. Denies headche but feel like she is SOB and heart racing.  Reports irregular ctx as well. Denies any vag bleeding or leaking.  LMP:  Onset of complaint: yesterday Pain score: 7 Vitals:   01/28/24 1552  BP: (!) 141/96  Pulse: (!) 108  Resp: 18  Temp: 98.5 F (36.9 C)     FHT: 145  Lab orders placed from triage: u/a PCR

## 2024-01-29 ENCOUNTER — Inpatient Hospital Stay (HOSPITAL_COMMUNITY): Admitting: Anesthesiology

## 2024-01-29 ENCOUNTER — Encounter (HOSPITAL_COMMUNITY): Payer: Self-pay | Admitting: Obstetrics and Gynecology

## 2024-01-29 LAB — CBC
HCT: 36.8 % (ref 36.0–46.0)
Hemoglobin: 12.2 g/dL (ref 12.0–15.0)
MCH: 29.2 pg (ref 26.0–34.0)
MCHC: 33.2 g/dL (ref 30.0–36.0)
MCV: 88 fL (ref 80.0–100.0)
Platelets: 249 K/uL (ref 150–400)
RBC: 4.18 MIL/uL (ref 3.87–5.11)
RDW: 14.9 % (ref 11.5–15.5)
WBC: 14.6 K/uL — ABNORMAL HIGH (ref 4.0–10.5)
nRBC: 0 % (ref 0.0–0.2)

## 2024-01-29 LAB — GLUCOSE, CAPILLARY: Glucose-Capillary: 79 mg/dL (ref 70–99)

## 2024-01-29 MED ORDER — PRENATAL MULTIVITAMIN CH
1.0000 | ORAL_TABLET | Freq: Every day | ORAL | Status: DC
  Administered 2024-01-30 – 2024-01-31 (×2): 1 via ORAL
  Filled 2024-01-29 (×2): qty 1

## 2024-01-29 MED ORDER — QUETIAPINE FUMARATE 300 MG PO TABS
300.0000 mg | ORAL_TABLET | Freq: Every day | ORAL | Status: DC
  Administered 2024-01-29 – 2024-01-31 (×2): 300 mg via ORAL
  Filled 2024-01-29 (×3): qty 1

## 2024-01-29 MED ORDER — ONDANSETRON HCL 4 MG/2ML IJ SOLN
4.0000 mg | INTRAMUSCULAR | Status: DC | PRN
  Administered 2024-01-31: 4 mg via INTRAVENOUS
  Filled 2024-01-29 (×2): qty 2

## 2024-01-29 MED ORDER — SENNOSIDES-DOCUSATE SODIUM 8.6-50 MG PO TABS: 2.0000 | ORAL_TABLET | Freq: Every day | ORAL | Status: DC

## 2024-01-29 MED ORDER — LIDOCAINE HCL (PF) 1 % IJ SOLN
INTRAMUSCULAR | Status: DC | PRN
  Administered 2024-01-29: 6 mL via EPIDURAL

## 2024-01-29 MED ORDER — LACTATED RINGERS IV SOLN: INTRAVENOUS | Status: AC

## 2024-01-29 MED ORDER — OXYCODONE HCL 5 MG PO TABS
10.0000 mg | ORAL_TABLET | ORAL | Status: DC | PRN
  Administered 2024-01-29 – 2024-01-31 (×4): 10 mg via ORAL
  Filled 2024-01-29 (×6): qty 2

## 2024-01-29 MED ORDER — ALBUTEROL SULFATE (2.5 MG/3ML) 0.083% IN NEBU
2.5000 mg | INHALATION_SOLUTION | Freq: Four times a day (QID) | RESPIRATORY_TRACT | Status: DC
  Administered 2024-01-29 – 2024-01-31 (×2): 2.5 mg via RESPIRATORY_TRACT
  Filled 2024-01-29 (×3): qty 3

## 2024-01-29 MED ORDER — OXYTOCIN-SODIUM CHLORIDE 30-0.9 UT/500ML-% IV SOLN: 2.5000 [IU]/h | INTRAVENOUS | Status: DC | PRN

## 2024-01-29 MED ORDER — OXYCODONE HCL 5 MG PO TABS
5.0000 mg | ORAL_TABLET | ORAL | Status: DC | PRN
  Administered 2024-01-30 (×2): 5 mg via ORAL
  Filled 2024-01-29: qty 1

## 2024-01-29 MED ORDER — SIMETHICONE 80 MG PO CHEW: 80.0000 mg | CHEWABLE_TABLET | ORAL | Status: DC | PRN

## 2024-01-29 MED ORDER — DIBUCAINE (PERIANAL) 1 % EX OINT: 1.0000 | TOPICAL_OINTMENT | CUTANEOUS | Status: DC | PRN

## 2024-01-29 MED ORDER — TERBUTALINE SULFATE 1 MG/ML IJ SOLN: 0.2500 mg | Freq: Once | INTRAMUSCULAR | Status: DC | PRN

## 2024-01-29 MED ORDER — ZOLPIDEM TARTRATE 5 MG PO TABS: 5.0000 mg | ORAL_TABLET | Freq: Every evening | ORAL | Status: DC | PRN

## 2024-01-29 MED ORDER — ACETAMINOPHEN 500 MG PO TABS
1000.0000 mg | ORAL_TABLET | Freq: Three times a day (TID) | ORAL | Status: DC
  Administered 2024-01-29 – 2024-01-31 (×6): 1000 mg via ORAL
  Filled 2024-01-29 (×6): qty 2

## 2024-01-29 MED ORDER — BENZOCAINE-MENTHOL 20-0.5 % EX AERO
1.0000 | INHALATION_SPRAY | CUTANEOUS | Status: DC | PRN
  Administered 2024-01-31: 1 via TOPICAL
  Filled 2024-01-29: qty 56

## 2024-01-29 MED ORDER — RHO D IMMUNE GLOBULIN 1500 UNIT/2ML IJ SOSY
300.0000 ug | PREFILLED_SYRINGE | Freq: Once | INTRAMUSCULAR | Status: AC
  Administered 2024-01-30: 300 ug via INTRAVENOUS
  Filled 2024-01-29: qty 2

## 2024-01-29 MED ORDER — DIPHENHYDRAMINE HCL 25 MG PO CAPS: 25.0000 mg | ORAL_CAPSULE | Freq: Four times a day (QID) | ORAL | Status: DC | PRN

## 2024-01-29 MED ORDER — FLUTICASONE PROPIONATE 50 MCG/ACT NA SUSP
2.0000 | Freq: Every day | NASAL | Status: DC
  Administered 2024-01-29 – 2024-01-31 (×2): 2 via NASAL
  Filled 2024-01-29: qty 16

## 2024-01-29 MED ORDER — WITCH HAZEL-GLYCERIN EX PADS: 1.0000 | MEDICATED_PAD | CUTANEOUS | Status: DC | PRN

## 2024-01-29 MED ORDER — MISOPROSTOL 25 MCG QUARTER TABLET: 25.0000 ug | ORAL_TABLET | Freq: Once | ORAL | Status: DC

## 2024-01-29 MED ORDER — COCONUT OIL OIL
1.0000 | TOPICAL_OIL | Status: DC | PRN
  Administered 2024-01-31: 1 via TOPICAL

## 2024-01-29 MED ORDER — OXYTOCIN-SODIUM CHLORIDE 30-0.9 UT/500ML-% IV SOLN: 1.0000 m[IU]/min | INTRAVENOUS | Status: DC

## 2024-01-29 MED ORDER — AMMONIA AROMATIC IN INHA
RESPIRATORY_TRACT | Status: AC
  Filled 2024-01-29: qty 10

## 2024-01-29 MED ORDER — IBUPROFEN 600 MG PO TABS
600.0000 mg | ORAL_TABLET | Freq: Four times a day (QID) | ORAL | Status: DC
  Administered 2024-01-29: 600 mg via ORAL
  Filled 2024-01-29: qty 1

## 2024-01-29 MED ORDER — ACETAMINOPHEN 325 MG PO TABS: 650.0000 mg | ORAL_TABLET | ORAL | Status: DC | PRN

## 2024-01-29 MED ORDER — IBUPROFEN 800 MG PO TABS
800.0000 mg | ORAL_TABLET | Freq: Three times a day (TID) | ORAL | Status: DC
  Administered 2024-01-29 – 2024-01-31 (×6): 800 mg via ORAL
  Filled 2024-01-29 (×6): qty 1

## 2024-01-29 MED ORDER — ONDANSETRON HCL 4 MG PO TABS: 4.0000 mg | ORAL_TABLET | ORAL | Status: DC | PRN

## 2024-01-29 MED ORDER — TETANUS-DIPHTH-ACELL PERTUSSIS 5-2-15.5 LF-MCG/0.5 IM SUSP: 0.5000 mL | Freq: Once | INTRAMUSCULAR | Status: DC

## 2024-01-29 NOTE — Progress Notes (Signed)
 I was called urgently into the room by another RN who stated that the family of the patient came out into the hall and grabbed her because the patient needed help immediately in the bathroom. Upon my arrival, the patient was on the toilet with the [facility] in front of her. She was visibly pale, tearful, and shaking. She said she felt lightheaded and was in 10/10 pain in her lower back, lower abdomen, and perineum. With the assistance of another RN, I helped the patient back onto the [facility] and got her back to bed. 10 mg of oxycodone  was given to treat her pain. Blood pressure elevated, other vitals WDL. Patient and family instructed to please call RN before getting the patient up again and to please not operate the [facility] without staff assistance. Patient's family stated that they did use the call bell before getting the patient up, but no one came. Secretary has no recollection of this call and RN was not notified. MD notified of patient's current condition.

## 2024-01-29 NOTE — Progress Notes (Signed)
 BP (!) 130/92   Pulse 82   Temp 98.7 F (37.1 C) (Oral)   Resp 18   Ht 5' 3 (1.6 m)   Wt 82.4 kg   LMP  (LMP Unknown) Comment: irreg cycles, wasn't tracking  SpO2 100%   Breastfeeding Unknown   BMI 32.18 kg/m  Presented to patient bedside for evaluation. Baby girl on FOB chest s/p eval, doing well. Patient seen in bathroom with RN. Using steady to get to bathroom. Has just voided. Minimal lochia in toilet as well as on pad. No point TTP over lumbar spine, some generalized paraspinal tension but no frank CVA TTP BL. Patient denies dysuria. Endorses pain mainly in lower abdomen towards her left side. Has not eaten or drank much since delivery. Denies vaginal pressure. PO oxy has been main medication to bring pain levels down. Also confirms her 300mg  Seroquel  dosing at bedtime.   Once patient brought back to bed and laid supine (with aid from myself and RN - pt has FROM in hips and lower spine but appears uncomfortable and moves slowly), abdominal exam as follows: soft, no ecchymoses on abdomen. Fundus firm approx 2cm suprapubically with irr contour on left aspect c/w known fibroid. Increased TTP over fibroid itself. Gentle digital exam negative for RVS hematoma. No sutures present as no laceration noted at delivery. Bladder scanner on TAUS function used to scan which showed then EMS, low concern for RPOC and approx 4x4x3cm fibroid c/w anatomy scan. Basic scan however possible degeneration noted. Reviewed this with patient and discussed need for IV hydration, scheduled pain medication. Low concern for UTI however out of abundance of caution, will order culture. Bedtime seroquel  also ordered. FOB present for conversation, all questions answered. Heating pad brought in for abdomen as well. Advised pt to ambulate/practice ambulating as she can (let RN staff know) so as to not cause more muscle stiffness.

## 2024-01-29 NOTE — Lactation Note (Addendum)
 This note was copied from a baby's chart. Lactation Consultation Note  Patient Name: Chelsea [name] Today's Date: 01/29/2024 Age:38 hours Reason for consult: Initial assessment;Infant < 6lbs;Early term 37-38.6wks  C/O: P3, ETI and infant less than 5 lbs 8 ounces, sleepy infant,  infant having medium emesis of curdle milk.  MOB latched infant briefly for 3 minutes on her left breast using the football hold position, infant sustained her latch and actively feed but after 3 minutes she became sleepy. Per MOB, wanted to use a curve tip syringe, infant did not do well with extra slow flow nipple, LC will mention family try White NFant nipple at the next feeding. After latching at the breast for 3 minutes, infant was given 3 mls of MOB EBM that she pumped and 9 mls of 22 kcal formula by spoon, due infant tongue thrusting the curve tip syringe that MOB wanted to try. MOB will follow LBW/ LPTI feeding guidelines, MY Care Plan  card given. LC discussed importance of maternal rest, meals and hydration. MOB was made aware of O/P services, breastfeeding support groups, community resources, and our phone # for post-discharge questions.    MOB is aware that her EBM is safe for 4 hours whereas formula RTF once open is 1 hour.  Current Feeding Plan: Day 1 1-MOB will continue to breastfeed infant every 3 hours and limit breast ( chest) 10 minutes or less, total feedings breast and bottle to 30 minutes or less. 2-MOB will supplement infant with any EBM first and then 22 kcal formula : Day 1(12-14 mls) per feeding or more if infant wants it. 3-MOB will continue to use the DEBP fitted with 18 ml breast flange, every 3 hours for 15 minutes.  4-MOB knows to call for further latch assistance if needed.   Maternal Data Has patient been taught Hand Expression?: Yes Does the patient have breastfeeding experience prior to this delivery?: Yes How long did the patient breastfeed?: Per MOB, she breastfeed her 1st child  for 6 months and 2nd child for 2 years  Feeding Mother's Current Feeding Choice: Breast Milk and Formula  LATCH Score Latch: Grasps breast easily, tongue down, lips flanged, rhythmical sucking. (Infant became tired after latching well for 3 minutes.)  Audible Swallowing: A few with stimulation  Type of Nipple: Everted at rest and after stimulation  Comfort (Breast/Nipple): Soft / non-tender  Hold (Positioning): Assistance needed to correctly position infant at breast and maintain latch.  LATCH Score: 8   Lactation Tools Discussed/Used Tools: Flanges;Pump Flange Size: 18 Breast pump type: Double-Electric Breast Pump;Manual Pump Education: Setup, frequency, and cleaning;Milk Storage Reason for Pumping: Infant is ETI and less than 5 lbs 8 ounces Pumping frequency: MOB will continue to pump every 3 hours for 15 minutes Pumped volume: 3 mL  Interventions Interventions: Breast feeding basics reviewed;Assisted with latch;Skin to skin;Breast compression;Adjust position;Support pillows;Position options;Expressed milk;Hand express;Education;DEBP;Hand pump;Pace feeding;Guidelines for Milk Supply and Pumping Schedule Handout;CDC milk storage guidelines;CDC Guidelines for Breast Pump Cleaning;LC Services brochure  Discharge Pump: DEBP;Personal  Consult Status Consult Status: Follow-up Date: 01/30/24 Follow-up type: In-patient    Grayce LULLA Batter 01/29/2024, 5:49 PM

## 2024-01-29 NOTE — Anesthesia Preprocedure Evaluation (Signed)
 "                                  Anesthesia Evaluation  Patient identified by MRN, date of birth, ID band Patient awake    Reviewed: Allergy & Precautions, NPO status , Patient's Chart, lab work & pertinent test results  Airway Mallampati: II  TM Distance: >3 FB Neck ROM: Full    Dental no notable dental hx.    Pulmonary asthma    Pulmonary exam normal        Cardiovascular hypertension,  Rhythm:Regular Rate:Normal     Neuro/Psych   Anxiety Depression Bipolar Disorder   negative neurological ROS     GI/Hepatic negative GI ROS, Neg liver ROS,,,  Endo/Other  negative endocrine ROS    Renal/GU negative Renal ROS  negative genitourinary   Musculoskeletal negative musculoskeletal ROS (+)    Abdominal Normal abdominal exam  (+)   Peds  Hematology Lab Results      Component                Value               Date                      WBC                      12.4 (H)            01/28/2024                HGB                      12.8                01/28/2024                HCT                      37.7                01/28/2024                MCV                      86.3                01/28/2024                PLT                      276                 01/28/2024              Anesthesia Other Findings   Reproductive/Obstetrics (+) Pregnancy                              Anesthesia Physical Anesthesia Plan  ASA: 2  Anesthesia Plan: Epidural   Post-op Pain Management:    Induction:   PONV Risk Score and Plan: 2 and Treatment may vary due to age or medical condition  Airway Management Planned: Natural Airway  Additional Equipment: None  Intra-op Plan:   Post-operative Plan:   Informed Consent: I have reviewed the patients  History and Physical, chart, labs and discussed the procedure including the risks, benefits and alternatives for the proposed anesthesia with the patient or authorized representative who has  indicated his/her understanding and acceptance.     Dental advisory given  Plan Discussed with:   Anesthesia Plan Comments:         Anesthesia Quick Evaluation  "

## 2024-01-29 NOTE — Anesthesia Postprocedure Evaluation (Signed)
"   Anesthesia Post Note  Patient: Chelsea [name]  Procedure(s) Performed: AN AD HOC LABOR EPIDURAL     Patient location during evaluation: Mother Baby Anesthesia Type: Epidural Level of consciousness: awake and alert Pain management: pain level controlled Vital Signs Assessment: post-procedure vital signs reviewed and stable Respiratory status: spontaneous breathing, nonlabored ventilation and respiratory function stable Cardiovascular status: stable Postop Assessment: no headache, no backache and epidural receding Anesthetic complications: no   No notable events documented.  Last Vitals:  Vitals:   01/29/24 1016 01/29/24 1200  BP: 129/84 124/86  Pulse: 92 90  Resp: 18 18  Temp: 37.1 C 37 C  SpO2: 99% 99%    Last Pain:  Vitals:   01/29/24 1519  TempSrc:   PainSc: 10-Worst pain ever   Pain Goal:                   [name]      "

## 2024-01-29 NOTE — Anesthesia Procedure Notes (Addendum)
 Epidural Patient location during procedure: OB Start time: 01/29/2024 6:15 AM End time: 01/29/2024 6:22 AM  Staffing Anesthesiologist: Dorethea Cordella SQUIBB, DO Performed: anesthesiologist   Preanesthetic Checklist Completed: patient identified, IV checked, site marked, risks and benefits discussed, surgical consent, monitors and equipment checked, pre-op evaluation and timeout performed  Epidural Patient position: sitting Prep: ChloraPrep Patient monitoring: heart rate, continuous pulse ox and blood pressure Approach: midline Location: L3-L4 Injection technique: LOR saline  Needle:  Needle type: Tuohy  Needle gauge: 17 G Needle length: 9 cm Needle insertion depth: 7 cm Catheter type: closed end flexible Catheter size: 20 Guage Catheter at skin depth: 13 cm Test dose: negative and 1.5% lidocaine  with Epi 1:200 K  Assessment Events: blood not aspirated, no cerebrospinal fluid, injection not painful, no injection resistance and no paresthesia  Additional Notes Patient identified. Risks/Benefits/Options discussed with patient including but not limited to bleeding, infection, nerve damage, paralysis, failed block, incomplete pain control, headache, blood pressure changes, nausea, vomiting, reactions to medications, itching and postpartum back pain. Confirmed with bedside nurse the patient's most recent platelet count. Confirmed with patient that they are not currently taking any anticoagulation, have any bleeding history or any family history of bleeding disorders. Patient expressed understanding and wished to proceed. All questions were answered. Sterile technique was used throughout the entire procedure. Please see nursing notes for vital signs. Test dose was given through epidural catheter and negative prior to continuing to dose epidural or start infusion. Warning signs of high block given to the patient including shortness of breath, tingling/numbness in hands, complete motor block, or  any concerning symptoms with instructions to call for help. Patient was given instructions on fall risk and not to get out of bed. All questions and concerns addressed with instructions to call with any issues or inadequate analgesia.    Reason for block:procedure for pain

## 2024-01-30 LAB — SYPHILIS: RPR W/REFLEX TO RPR TITER AND TREPONEMAL ANTIBODIES, TRADITIONAL SCREENING AND DIAGNOSIS ALGORITHM: RPR Ser Ql: NONREACTIVE

## 2024-01-30 LAB — URINALYSIS, W/ REFLEX TO CULTURE (INFECTION SUSPECTED)
Bilirubin Urine: NEGATIVE
Glucose, UA: NEGATIVE mg/dL
Ketones, ur: NEGATIVE mg/dL
Nitrite: NEGATIVE
Protein, ur: NEGATIVE mg/dL
RBC / HPF: >50 RBC/hpf (ref 0–5)
Specific Gravity, Urine: 1.011 (ref 1.005–1.030)
pH: 6 (ref 5.0–8.0)

## 2024-01-30 LAB — CBC
HCT: 30.4 % — ABNORMAL LOW (ref 36.0–46.0)
Hemoglobin: 10.2 g/dL — ABNORMAL LOW (ref 12.0–15.0)
MCH: 29.5 pg (ref 26.0–34.0)
MCHC: 33.6 g/dL (ref 30.0–36.0)
MCV: 87.9 fL (ref 80.0–100.0)
Platelets: 225 K/uL (ref 150–400)
RBC: 3.46 MIL/uL — ABNORMAL LOW (ref 3.87–5.11)
RDW: 15.2 % (ref 11.5–15.5)
WBC: 10.5 K/uL (ref 4.0–10.5)
nRBC: 0 % (ref 0.0–0.2)

## 2024-01-30 LAB — HIV ANTIBODY (ROUTINE TESTING W REFLEX): HIV Screen 4th Generation wRfx: NONREACTIVE

## 2024-01-30 MED ORDER — SIMETHICONE 80 MG PO CHEW
80.0000 mg | CHEWABLE_TABLET | Freq: Four times a day (QID) | ORAL | Status: DC
  Administered 2024-01-30 – 2024-01-31 (×4): 80 mg via ORAL
  Filled 2024-01-30 (×4): qty 1

## 2024-01-30 MED ORDER — DOCUSATE SODIUM 100 MG PO CAPS
100.0000 mg | ORAL_CAPSULE | Freq: Two times a day (BID) | ORAL | Status: DC
  Administered 2024-01-30 – 2024-01-31 (×3): 100 mg via ORAL
  Filled 2024-01-30 (×3): qty 1

## 2024-01-30 NOTE — Progress Notes (Signed)
 POSTPARTUM PROGRESS NOTE  Post Partum Day #1  Subjective:  No acute events overnight.  Pt denies problems with voiding or po intake. Notes that voiding hurts 2/2 perineal swelling, has ice packs in place. Was mobilized via steady overnight. She denies nausea or vomiting.  Lochia minimal. Reviewed that mobilization/ambulation is key to get bowels active especially on scheduled oxy for fibroid pain. Reviewed ambulating with assistance today  Objective: Blood pressure 114/80, pulse 71, temperature 98.7 F (37.1 C), temperature source Oral, resp. rate 18, height 5' 3 (1.6 m), weight 82.4 kg, SpO2 99%, unknown if currently breastfeeding.  Physical Exam:  General: alert, cooperative and no distress Lochia:normal flow Chest: CTAB Heart: RRR no m/r/g Pt has FROM in hips and lower spine again this AM; abdominal exam as follows: soft, no ecchymoses on abdomen. Fundus firm approx 2cm suprapubically with irr contour on left aspect c/w known fibroid. Increased TTP over fibroid itself however improved from yesterday. Gentle digital exam negative for RVS hematoma. No sutures present as no laceration noted at delivery. Only differed in exam this AM is mild distension c/w trapped gas. Extremities: neg edema, neg calf TTP BL, neg Homans BL  Recent Labs    01/29/24 0846 01/30/24 0502  HGB 12.2 10.2*  HCT 36.8 30.4*    Assessment/Plan:  ASSESSMENT: Janney Keleher is a 39 y.o. [id number] s/p SVD @ [redacted]w[redacted]d. PNC c/b Rh neg, fibroid.   Plan for discharge tomorrow and Lactation consult Fibroid pain - improving today however pt needs to ambulate. GI meds scheduled   LOS: 2 days

## 2024-01-30 NOTE — Clinical Social Work Maternal (Addendum)
 CLINICAL SOCIAL WORK MATERNAL/CHILD NOTE  Patient Details  Name: Chelsea [name] MRN: [id number] Date of Birth: [birth year]  Date:  01/30/2024  Clinical Social Worker Initiating Note:  Sharyne Roulette, LCSWA Date/Time: Initiated:  01/30/24/1721     Child's Name:  Chelsea [name]   Biological Parents:  Mother, [name] (FOB: Chelsea [name], DOB: 02/20/1987)   Need for Interpreter:  None   Reason for Referral:  Behavioral Health Concerns   Address:  [address] [place] [address]    Phone number:  [phone] (home)     Additional phone number:   Household Members/Support Persons (HM/SP):   Household Member/Support Person 1, Household Member/Support Person 2   HM/SP Name Relationship DOB or Age  HM/SP -1 Chelsea [name] Daughter 09/27/2016  HM/SP -2 Chelsea [name] FOB/Spouse 02/20/1987  HM/SP -3        HM/SP -4        HM/SP -5        HM/SP -6        HM/SP -7        HM/SP -8          Natural Supports (not living in the home):  Friends   Professional Supports: Other (Comment) (Miriam Dineen, PA-C [facility])   Employment: Full-time   Type of Work: [employer]   Education:  [employer] school graduate   Homebound arranged:    Surveyor, Quantity Resources:  Media Planner    Other Resources:      Cultural/Religious Considerations Which May Impact Care:  Per [employer], she identifies as non-denominational  Strengths:  Home prepared for child  , Psychotropic Medications, Pediatrician chosen   Psychotropic Medications:  Seroquel       Pediatrician:    [place]  Pediatrician List:   [facility] ([address])   [facility]      Pediatrician Fax Number:    Risk Factors/Current Problems:  Mental Health Concerns     Cognitive State:  Able to Concentrate  , Alert  , Goal Oriented  , Linear Thinking     Mood/Affect:  Interested  , Constricted  , Calm  ,  Apprehensive  , Flat     CSW Assessment: CSW was consulted due to history of Bipolar Disorder, depression, and anxiety. CSW met with MOB at bedside to complete assessment. When CSW entered room, MOB was observed sitting in hospital bed nursing infant Chelsea [name]. FOB was present sitting nearby. CSW introduced self and offered privacy. MOB provided verbal consent for FOB to remain in the room. CSW explained reason for consult. MOB presented as calm, was agreeable to consult and remained engaged throughout encounter. During encounter, MOB presented as initially reserved and FOB contributed to history with MOB's consent. FOB was observed as supportive and encouraging towards MOB which MOB appeared receptive to.   MOB confirmed demographic information listed in chart. MOB reports her oldest child, Chelsea [name] (DOB: 05/20/2010) resides with Chelsea [name] and step mother. MOB states that Chelsea [name] and step mother recently moved and she is unsure of what city they currently reside in. MOB states she did not go to court to determine custody arrangement and they have a spoken agreement where Chelsea [name] visits with MOB. MOB denied CPS involvement in custody determination.   CSW assessed current mood and inquired about mental health history. MOB reports she is feeling emotionally relieved that she has given  birth and reports feeling tired. FOB and MOB shared how MOB has had a physically and emotionally difficult pregnancy, marked by chronic fatigue, irritability, and nausea. MOB states that she feels her pregnancy with infant was difficult in part due to her age. MOB acknowledged diagnoses of Bipolar Depression and anxiety. MOB reports she experienced a deep depression following the birth of her now 74-year old daughter, Chelsea [name], marked by isolating herself and endorsing SI off and on. MOB reports that it took years to come out of her depression following Chelsea [name] birth, sharing that it took awhile to find  the right combination of medications. MOB reports she began receiving psychiatry and therapy services through the [facility] after Chelsea [name] birth. MOB states she is still followed for psychiatric medication management but does not currently meet with a therapist due to the agency no longer offering therapy. MOB states prior to pregnancy, she was prescribed Depakote , Lorazepam , and Seroquel  but since becoming pregnant, is prescribed only Seroquel  300mg  at night. MOB reports she has an upcoming appointment 2/26 at [facility] and is considering restarting medications she discontinued during pregnancy. CSW inquired further about mood during pregnancy. MOB reports having moments of depression during pregnancy but feels she was able to come out of it, unlike how she felt following the birth of her daughter Chelsea [name]. MOB states she feels she was able to manage well considering how poorly she felt physically during pregnancy. CSW inquired about SI. MOB states she does not think she has ever attempted suicide and last endorsed suicidal ideation a couple years ago. MOB expressed interest in meeting with a therapist postpartum in addition to continued medication management. CSW provided MOB with outpatient and crisis mental health resources. CSW inquired about coping skills. MOB identified cleaning and spending time with her best friend as coping skills. CSW assessed for safety. MOB denied current SI/HI.  CSW provided education regarding the baby blues period vs. perinatal mood disorders, discussed treatment and gave resources for mental health follow up if concerns arise. CSW recommends self-evaluation during the postpartum time period using the New Mom Checklist from Postpartum Progress and encouraged MOB to contact a medical professional if symptoms are noted at any time.    MOB reports she has all needed items for infant, including a car seat and bassinet. CSW inquired about additional  resource needs. MOB expressed interest in [facility], CSW placed [facility] referral and provided income eligibility information per FOB's request.  CSW provided review of Sudden Infant Death Syndrome (SIDS) precautions.    CSW identifies no further need for intervention and no barriers to discharge at this time.   CSW Plan/Description:  No Further Intervention Required/No Barriers to Discharge, Sudden Infant Death Syndrome (SIDS) Education, Perinatal Mood and Anxiety Disorder (PMADs) Education, Other Information/Referral to [employer] K Jolly, LCSWA 01/30/2024, 5:28 PM

## 2024-01-31 LAB — RH IG WORKUP (INCLUDES ABO/RH)
Fetal Screen: NEGATIVE
Gestational Age(Wks): 38
Unit division: 0

## 2024-01-31 MED ORDER — ACETAMINOPHEN 500 MG PO TABS: 1000.0000 mg | ORAL_TABLET | Freq: Four times a day (QID) | ORAL | Status: AC

## 2024-01-31 MED ORDER — POLYETHYLENE GLYCOL 3350 17 G PO PACK
17.0000 g | PACK | Freq: Every day | ORAL | Status: DC
  Administered 2024-01-31: 17 g via ORAL
  Filled 2024-01-31: qty 1

## 2024-01-31 MED ORDER — LORAZEPAM 2 MG/ML IJ SOLN
0.5000 mg | Freq: Once | INTRAMUSCULAR | Status: AC
  Administered 2024-01-31: 0.5 mg via INTRAVENOUS
  Filled 2024-01-31: qty 1

## 2024-01-31 MED ORDER — IBUPROFEN 200 MG PO TABS: 600.0000 mg | ORAL_TABLET | Freq: Four times a day (QID) | ORAL | Status: AC

## 2024-01-31 NOTE — Discharge Summary (Signed)
 "    Postpartum Discharge Summary  Date of Service updated 1/2-01/31/2024     Patient Name: Chelsea [name] DOB: 1985-06-07 MRN: [id number]  Date of admission: 01/28/2024 Delivery date:01/29/2024 Delivering provider: DELANA BASE [facility] Date of discharge: 01/31/2024  Admitting diagnosis: elevated blood pressure and headache Intrauterine pregnancy: [redacted]w[redacted]d     Secondary diagnosis:  history of asthma, h/o PTD, AMA, Rh neg  Additional problems: bipolar d/o, panic attack, painful fibroid, low back pain    Discharge diagnosis: Term Pregnancy Delivered                                              Post partum procedures:rhogam Augmentation: Pitocin  and Cytotec  Complications: None  Hospital course: Induction of Labor With Vaginal Delivery   39 y.o. yo [phone] at [redacted]w[redacted]d was admitted to the hospital 01/28/2024 for induction of labor.  Indication for induction: elevated blood pressures and headache at term.  Patient had an labor course complicated by nothing Membrane Rupture Time/Date: 6:24 AM,01/29/2024  Delivery Method:Vaginal, Spontaneous Episiotomy: None Lacerations:  None Details of delivery can be found in separate delivery note.  Patient had a postpartum course complicated by bipolar d/o, panic attack, painful fibroid, and low back pain. Patient is discharged home 01/31/2024.  Newborn Data: Birth date:01/29/2024 Birth time:6:31 AM Gender:Female Living status:Living Apgars:6 ,9  Weight:2420 g  Magnesium Sulfate received: No BMZ received: No Rhophylac :Yes MMR:No T-DaP:Given prenatally Flu: Yes RSV Vaccine received: No Transfusion:No Immunizations administered: Immunization History  Administered Date(s) Administered   Influenza, Quadrivalent, Recombinant, Inj, Pf 12/08/2018   Influenza,inj,Quad PF,6+ Mos 02/28/2020   Tdap 07/01/2016    Physical exam  Vitals:   01/30/24 2100 01/30/24 2245 01/31/24 0524 01/31/24 1238  BP: 135/87 129/82 113/77 122/77  Pulse: 72  68 97  Resp: 18  18 18    Temp: 98.1 F (36.7 C)  98.8 F (37.1 C) 98.2 F (36.8 C)  TempSrc: Oral  Oral Oral  SpO2: 99%  98% 100%  Weight:      Height:       General: alert, cooperative, and no distress Lochia: appropriate Uterine Fundus: firm DVT Evaluation: No evidence of DVT seen on physical exam. Labs: Lab Results  Component Value Date   WBC 10.5 01/30/2024   HGB 10.2 (L) 01/30/2024   HCT 30.4 (L) 01/30/2024   MCV 87.9 01/30/2024   PLT 225 01/30/2024      Latest Ref Rng & Units 01/28/2024    4:21 PM  CMP  Glucose 70 - 99 mg/dL 92   BUN 6 - 20 mg/dL <5   Creatinine 9.55 - 1.00 mg/dL 9.36   Sodium 864 - 854 mmol/L 137   Potassium 3.5 - 5.1 mmol/L 4.5   Chloride 98 - 111 mmol/L 106   CO2 22 - 32 mmol/L 20   Calcium  8.9 - 10.3 mg/dL 9.4   Total Protein 6.5 - 8.1 g/dL 6.5   Total Bilirubin 0.0 - 1.2 mg/dL 0.4   Alkaline Phos 38 - 126 U/L 138   AST 15 - 41 U/L 16   ALT 0 - 44 U/L 8    Edinburgh Score:    09/29/2016    5:00 PM  Edinburgh Postnatal Depression Scale Screening Tool  I have been able to laugh and see the funny side of things. 0   I have looked forward with enjoyment to things. 0  I have blamed myself unnecessarily when things went wrong. 2   I have been anxious or worried for no good reason. 2   I have felt scared or panicky for no good reason. 1   Things have been getting on top of me. 2   I have been so unhappy that I have had difficulty sleeping. 0   I have felt sad or miserable. 1   I have been so unhappy that I have been crying. 0   The thought of harming myself has occurred to me. 0   Edinburgh Postnatal Depression Scale Total 8      Data saved with a previous flowsheet row definition      After visit meds:  Allergies as of 01/31/2024       Reactions   Hydromet [hydrocodone  Bit-homatrop Mbr] Anaphylaxis, Itching, Swelling   Patient states that her throat closes   Morphine  And Codeine Itching, Other (See Comments)   Makes entire burn and itch SEVERELY; clawed  her skin   Penicillins Palpitations   Has patient had a PCN reaction causing immediate rash, facial/tongue/throat swelling, SOB or lightheadedness with hypotension: Yes Has patient had a PCN reaction causing severe rash involving mucus membranes or skin necrosis: No Has patient had a PCN reaction that required hospitalization: No Has patient had a PCN reaction occurring within the last 10 years: Yes If all of the above answers are NO, then may proceed with Cephalosporin use.        Medication List     STOP taking these medications    cyclobenzaprine  10 MG tablet Commonly known as: FLEXERIL    metoCLOPramide  10 MG tablet Commonly known as: REGLAN    ondansetron  4 MG disintegrating tablet Commonly known as: ZOFRAN -ODT   oseltamivir  75 MG capsule Commonly known as: TAMIFLU    pantoprazole  40 MG tablet Commonly known as: Protonix    promethazine  25 MG suppository Commonly known as: PHENERGAN        TAKE these medications    acetaminophen  500 MG tablet Commonly known as: TYLENOL  Take 2 tablets (1,000 mg total) by mouth every 6 (six) hours. What changed:  how much to take when to take this reasons to take this   budesonide -formoterol  80-4.5 MCG/ACT inhaler Commonly known as: Symbicort  Inhale 2 puffs into the lungs 2 (two) times daily. Can use 2 puffs as needed every 4 hours for wheezing or shortness of breath.  Maximum 12 total puffs per day. Use spacer.   famotidine  20 MG tablet Commonly known as: PEPCID  Take 1 tablet (20 mg total) by mouth 2 (two) times daily.   fluticasone  50 MCG/ACT nasal spray Commonly known as: FLONASE  Place 2 sprays into both nostrils daily.   ibuprofen  200 MG tablet Commonly known as: Advil  Take 3 tablets (600 mg total) by mouth every 6 (six) hours.   metoprolol  succinate 25 MG 24 hr tablet Commonly known as: Toprol  XL Take 0.5 tablets (12.5 mg total) by mouth daily.   Prenatal Adult Gummy/DHA/FA 0.4-25 MG Chew Chew 1 tablet by  mouth.   QUEtiapine  300 MG tablet Commonly known as: SEROQUEL  Take 1 tablet (300 mg total) by mouth at bedtime.         Discharge home in stable condition Infant Feeding: Breast Infant Disposition:home with mother Discharge instruction: per After Visit Summary and Postpartum booklet. Activity: Advance as tolerated. Pelvic rest for 6 weeks.  Diet: routine diet Anticipated Birth Control: Unsure Postpartum Appointment:4 weeks Additional Postpartum F/U: Postpartum Depression checkup and BP check 4 weeks Future  Appointments:No future appointments. Follow up Visit:  Follow-up Information     Ob/Gyn, Landy Stains Follow up in 4 week(s).   Contact information: [address] [place] [address] [phone]                     01/31/2024 Rubie DELENA Husky, MD   "

## 2024-01-31 NOTE — Lactation Note (Signed)
 This note was copied from a baby's chart. Lactation Consultation Note  Patient Name: Chelsea [name] Date: 01/31/2024 Age:38 hours Reason for consult: Follow-up assessment;Early term 37-38.6wks;Infant < 6lbs;Infant weight loss;Nipple pain/trauma;Engorgement;Breastfeeding assistance  P3- Infant was born at [redacted]w[redacted]d GA weighing [phone] and has had a total weight loss of 5.79%. Infant is showing some yellowing of the skin and eyes, but per NP Melanie she is below worrying levels. When Melanie left, MOB verbalized the same concern to [facility] again, but LC reassured MOB what Andrea said. LC encouraged MOB to give frequent feedings and to even post pump and offer EBM to flush out the bilirubin. MOB verbalized understanding and thanked LC for the same reassured NP Andrea gave them.   Infant was actively chomping on her paci, so LC encouraged latching one last time for LC. MOB agreed. MOB reports only being able to latch with the 16 mm nipple shield. MOB sat down, slapped the shield on her areola and was going to latch infant. LC stopped MOB and requested the proper way to apply the shield. LC reviewed how to turn the shield half way inside out, place it on the nipple, then have it suction the nipple in. MOB reports that this makes more sense than the way she was taught how to apply it. Infant eager latched on the left breast in the cradle hold. Infant mostly chomped, but some nutritive sucks were noted. Infant nursed for 8 minutes before coming off upset. LC reassured MOB that with practice the chomping should stop. LC reviewed the first 24 hr birthday nap, day 2 cluster feeding, feeding infant on cue 8-12x in 24 hrs, not allowing infant to go over 3 hrs without a feeding, CDC milk storage guidelines, LC services handout and engorgement/breast care. LC encouraged MOB to call for further assistance as needed.  Maternal Data Has patient been taught Hand Expression?: Yes Does the patient have breastfeeding  experience prior to this delivery?: Yes  Feeding Mother's Current Feeding Choice: Breast Milk and Formula  LATCH Score Latch: Repeated attempts needed to sustain latch, nipple held in mouth throughout feeding, stimulation needed to elicit sucking reflex. (chomping)  Audible Swallowing: A few with stimulation  Type of Nipple: Flat  Comfort (Breast/Nipple): Filling, red/small blisters or bruises, mild/mod discomfort  Hold (Positioning): Assistance needed to correctly position infant at breast and maintain latch.  LATCH Score: 5   Lactation Tools Discussed/Used Tools: Nipple Shields Nipple shield size: 16 Flange Size: 18 Breast pump type: Manual Pump Education: Setup, frequency, and cleaning;Milk Storage Reason for Pumping: nipple shield use, lpw birth weight infant Pumping frequency: 15-20 min every 3 hrs  Interventions Interventions: Breast feeding basics reviewed;Assisted with latch;Breast compression;Adjust position;Support pillows;Position options;Education;LC Services brochure;LPT handout/interventions  Discharge Discharge Education: Engorgement and breast care;Warning signs for feeding baby Pump: DEBP;Personal  Consult Status Consult Status: Complete Date: 01/31/24    Recardo Hoit BS, IBCLC 01/31/2024, 4:51 PM

## 2024-01-31 NOTE — Lactation Note (Signed)
 This note was copied from a baby's chart. Lactation Consultation Note  Patient Name: Chelsea [name] Today's Date: 01/31/2024 Age:38 hours   Attempted to see mom. Mom was sleeping but dad was awake caring for baby.  Maternal Data    Feeding    LATCH Score                    Lactation Tools Discussed/Used    Interventions    Discharge    Consult Status      Champ Keetch G 01/31/2024, 12:33 AM

## 2024-01-31 NOTE — Lactation Note (Signed)
 This note was copied from a baby's chart. Lactation Consultation Note  Patient Name: Chelsea [name] Date: 01/31/2024 Age:38 hours   Attempted to see mom again but everyone was sleeping.  Maternal Data    Feeding    LATCH Score                    Lactation Tools Discussed/Used    Interventions    Discharge    Consult Status      Sravya Grissom G 01/31/2024, 3:19 AM

## 2024-01-31 NOTE — Discharge Instructions (Signed)
 SABRA

## 2024-01-31 NOTE — Progress Notes (Signed)
 POSTPARTUM PROGRESS NOTE  Post Partum Day #2  Subjective:  Chantrell had an episode of vomiting and shaking this morning, resolved after zofran  and 0.5mg  ativan ; she went to sleep. Chelsea [name] reports she is feeling better now. She and FOB suspect that the episode was due to patient not taking her usual dose of Seroquel  last night because she declined it as she was too sleepy from the oxycodone  that she was taking. She says this happened once before when she didn't take her Seroquel . Otherwise, she reports low back pain, controlled with medication. Ambulating, voiding, and tolerating PO well. Passing gas. Lochia appropriate. Breastfeeding. She plans to get up and take a shower to see how she feels and consider whether she prefers discharge this afternoon or tomorrow.   Objective:    01/31/2024   12:38 PM 01/31/2024    5:24 AM 01/30/2024   10:45 PM  Vitals with BMI  Systolic 122 113 870  Diastolic 77 77 82  Pulse 97 68      Physical Exam:  General: no acute distress Pulm: normal work of breathing on room air Card: well perfused MSK: normal ROM Back: mild bruising near epidural site, non-tender to palpation Neuro: no focal deficits, oriented x3 Psych: normal mood, normal thought Abd/fundus: soft, no ecchymoses on abdomen.Mild distension. Fundus firm approx 2cm below umbilicus with irr contour on left aspect c/w known fibroid, non-tender at this time Extremities: no e/o DVT  Recent Labs    01/29/24 0846 01/30/24 0502  HGB 12.2 10.2*  HCT 36.8 30.4*    Assessment/Plan: Anesha Dezarn is a 39 y.o. [id number] s/p SVD @ [redacted]w[redacted]d after IOL for elevated blood pressures and headache, was not ruled in for gHTN.  -PPD2: Doing well. Continue routine postpartum care. -fibroid pain: improved -h/o bipolar, panic attack: continue home Seroquel . Recommend close f/u with her provider at [facility] to get back on breastfeeding safe meds. Gave mood precautions/reasons to call. -elevated Bps: thought to be  confounded by labor pains, progressed quickly. Normotensive and asymptomatic postpartum.  -low back pain: likely secondary to hospital bed. Epidural site non-tender to palpation. -Rh neg: received Rhogam.  Dispo: Anticipate discharge this afternoon versus tomorrow.    LOS: 3 days

## 2024-02-07 ENCOUNTER — Telehealth (HOSPITAL_COMMUNITY): Payer: Self-pay | Admitting: *Deleted

## 2024-02-07 NOTE — Telephone Encounter (Signed)
 Attempted hospital discharge follow-up call. Left message for patient to return RN call with any questions or concerns. Allean IVAR Carton, RN, 02/07/24, [phone]

## 2024-02-08 ENCOUNTER — Inpatient Hospital Stay (HOSPITAL_COMMUNITY)
Admission: AD | Admit: 2024-02-08 | Discharge: 2024-02-08 | Disposition: A | Discharge status: Home / Self Care | Attending: Obstetrics and Gynecology | Admitting: Obstetrics and Gynecology

## 2024-02-08 ENCOUNTER — Encounter (HOSPITAL_COMMUNITY): Payer: Self-pay | Admitting: Obstetrics and Gynecology

## 2024-02-08 ENCOUNTER — Inpatient Hospital Stay (HOSPITAL_COMMUNITY)

## 2024-02-08 DIAGNOSIS — R9389 Abnormal findings on diagnostic imaging of other specified body structures: Secondary | ICD-10-CM | POA: Insufficient documentation

## 2024-02-08 DIAGNOSIS — Z79899 Other long term (current) drug therapy: Secondary | ICD-10-CM | POA: Insufficient documentation

## 2024-02-08 DIAGNOSIS — R519 Headache, unspecified: Secondary | ICD-10-CM | POA: Insufficient documentation

## 2024-02-08 DIAGNOSIS — O165 Unspecified maternal hypertension, complicating the puerperium: Secondary | ICD-10-CM | POA: Diagnosis present

## 2024-02-08 DIAGNOSIS — O9089 Other complications of the puerperium, not elsewhere classified: Secondary | ICD-10-CM

## 2024-02-08 LAB — COMPREHENSIVE METABOLIC PANEL WITH GFR
ALT: 23 U/L (ref 0–44)
AST: 26 U/L (ref 15–41)
Albumin: 4.6 g/dL (ref 3.5–5.0)
Alkaline Phosphatase: 120 U/L (ref 38–126)
Anion gap: 13 (ref 5–15)
BUN: 18 mg/dL (ref 6–20)
CO2: 23 mmol/L (ref 22–32)
Calcium: 9.9 mg/dL (ref 8.9–10.3)
Chloride: 102 mmol/L (ref 98–111)
Creatinine, Ser: 0.83 mg/dL (ref 0.44–1.00)
GFR, Estimated: >60 mL/min
Glucose, Bld: 94 mg/dL (ref 70–99)
Potassium: 4.2 mmol/L (ref 3.5–5.1)
Sodium: 138 mmol/L (ref 135–145)
Total Bilirubin: 0.4 mg/dL (ref 0.0–1.2)
Total Protein: 7.8 g/dL (ref 6.5–8.1)

## 2024-02-08 LAB — CBC
HCT: 45.5 % (ref 36.0–46.0)
Hemoglobin: 15.1 g/dL — ABNORMAL HIGH (ref 12.0–15.0)
MCH: 29.4 pg (ref 26.0–34.0)
MCHC: 33.2 g/dL (ref 30.0–36.0)
MCV: 88.7 fL (ref 80.0–100.0)
Platelets: 344 K/uL (ref 150–400)
RBC: 5.13 MIL/uL — ABNORMAL HIGH (ref 3.87–5.11)
RDW: 14.2 % (ref 11.5–15.5)
WBC: 10.9 K/uL — ABNORMAL HIGH (ref 4.0–10.5)
nRBC: 0 % (ref 0.0–0.2)

## 2024-02-08 MED ORDER — MISOPROSTOL 200 MCG PO TABS
800.0000 ug | ORAL_TABLET | Freq: Once | ORAL | Status: AC
  Administered 2024-02-08: 800 ug via ORAL
  Filled 2024-02-08: qty 4

## 2024-02-08 MED ORDER — IBUPROFEN 600 MG PO TABS
600.0000 mg | ORAL_TABLET | Freq: Once | ORAL | Status: AC
  Administered 2024-02-08: 600 mg via ORAL
  Filled 2024-02-08: qty 1

## 2024-02-08 MED ORDER — CYCLOBENZAPRINE HCL 10 MG PO TABS
10.0000 mg | ORAL_TABLET | Freq: Once | ORAL | Status: AC
  Administered 2024-02-08: 10 mg via ORAL
  Filled 2024-02-08: qty 1

## 2024-02-08 MED ORDER — ENALAPRIL MALEATE 5 MG PO TABS: 5.0000 mg | ORAL_TABLET | Freq: Every day | ORAL | 0 refills | Status: AC

## 2024-02-08 MED ORDER — ENALAPRIL MALEATE 2.5 MG PO TABS
5.0000 mg | ORAL_TABLET | Freq: Once | ORAL | Status: AC
  Administered 2024-02-08: 5 mg via ORAL
  Filled 2024-02-08: qty 2

## 2024-02-08 MED ORDER — ACETAMINOPHEN-CAFFEINE 500-65 MG PO TABS
2.0000 | ORAL_TABLET | Freq: Once | ORAL | Status: AC
  Administered 2024-02-08: 2 via ORAL
  Filled 2024-02-08: qty 2

## 2024-02-08 NOTE — MAU Note (Signed)
 Chelsea [name] is a 39 y.o. at Unknown here in MAU reporting: s/p vag delivery 01/29/2024. Reports she was in the office today and reported to the provider that she was still bleeding heavy (8-10 pads per day) and still passing clots. Was told to come here for eval for same. Pt states her b/p was also elevated in the office  (150/90) and she has had a headache x 2 days. States she has been having chest pain for the last 2 days also although it is not hurting right now. States when it does hurt it is 8/10. Reports she is breast feeding without any problem.   Onset of complaint: today Pain score: 5/10 headache                     8/10 chest pain Vitals:   02/08/24 1618  BP: 134/86  Pulse: 92  Resp: 17  SpO2: 97%     FHT: na  Lab orders placed from triage: ekg '

## 2024-02-08 NOTE — Discharge Instructions (Addendum)
 Please call your doctor's office for blood pressure greater than 140/90. You need to see your doctor for a blood pressure recheck on Thursday 02/10/2024.      [facility]     [facility]                    [facility]                 [facility]    **Purchase ONE of these store-brand Excedrin Tension Headache medications. Take it as directed for relief of headache. DO NOT TAKE AT THE SAME TIME AS TYLENOL /ACETAMINOPHEN **

## 2024-02-08 NOTE — MAU Provider Note (Signed)
 " History     CSN: [id number]  Arrival date and time: 02/08/24 1524   Event Date/Time   First Provider Initiated Contact with Patient 02/08/24 1649      Chief Complaint  Patient presents with   Headache   Hypertension   HPI [name] is a 39 y.o. year old [id number] female at 10 days postpartum s/p SVD on 01/29/2024 who presents to MAU reporting at her baby's well-baby visit her BP was elevated (150/90) and she reported heavy VB and passing clots. She reports going through 8-10 pads/day. She also reports having a H/A x 2 days; rated 5/10. She also reports having CP x 2 days, but none today. She reports that when she was having the CP the pain was 8/10. She also reports that she was dx/'d with a nodule on her lung, but she has not had the opportunity to follow-up. She receives [facility] with [facility]. Her spouse is present and contributing to the history taking.   OB History     Gravida  3   Para  3   Term  2   Preterm  1   AB      Living  3      SAB      IAB      Ectopic      Multiple  0   Live Births  3        Obstetric Comments  2012 PPROM         Past Medical History:  Diagnosis Date   Anxiety    Asthma    Atypical chest pain    Back pain    Bipolar affective disorder (HCC)    Complication of anesthesia    woke up during surgery   Depression    Hyperlipidemia    UTI (urinary tract infection)     Past Surgical History:  Procedure Laterality Date   NASAL SEPTUM SURGERY  10/2022   TONSILLECTOMY      Family History  Problem Relation Age of Onset   Hypertension Mother    Hypertension Father    High Cholesterol Father    Heart block Father        partial   Heart attack Paternal Grandfather     Social History[1]  Allergies: Allergies[2]  Medications Prior to Admission  Medication Sig Dispense Refill Last Dose/Taking   acetaminophen  (TYLENOL ) 500 MG tablet Take 2 tablets (1,000 mg total) by mouth every 6 (six) hours.   02/08/2024  at  9:00 AM   ibuprofen  (ADVIL ) 200 MG tablet Take 3 tablets (600 mg total) by mouth every 6 (six) hours.   02/08/2024 at  9:00 AM   Prenatal MV & Min w/FA-DHA (PRENATAL ADULT GUMMY/DHA/FA) 0.4-25 MG CHEW Chew 1 tablet by mouth.   Past Week   QUEtiapine  (SEROQUEL ) 300 MG tablet Take 1 tablet (300 mg total) by mouth at bedtime. 30 tablet 0 02/07/2024   budesonide -formoterol  (SYMBICORT ) 80-4.5 MCG/ACT inhaler Inhale 2 puffs into the lungs 2 (two) times daily. Can use 2 puffs as needed every 4 hours for wheezing or shortness of breath.  Maximum 12 total puffs per day. Use spacer. 1 each 0    famotidine  (PEPCID ) 20 MG tablet Take 1 tablet (20 mg total) by mouth 2 (two) times daily. 60 tablet 0    fluticasone  (FLONASE ) 50 MCG/ACT nasal spray Place 2 sprays into both nostrils daily. 9.9 mL 0    metoprolol  succinate (TOPROL  XL) 25 MG 24  hr tablet Take 0.5 tablets (12.5 mg total) by mouth daily. 15 tablet 3     Review of Systems  Constitutional: Negative.   HENT: Negative.    Eyes: Negative.   Respiratory: Negative.    Cardiovascular: Negative.   Gastrointestinal: Negative.   Endocrine: Negative.   Genitourinary:  Positive for vaginal bleeding (heavy with clots; using 8-10 pads/day).  Musculoskeletal: Negative.   Skin: Negative.   Allergic/Immunologic: Negative.   Neurological:  Positive for headaches.  Hematological: Negative.   Psychiatric/Behavioral: Negative.     Physical Exam   Patient Vitals for the past 24 hrs:  BP Temp Temp src Pulse Resp SpO2  02/08/24 2001 (!) 134/95 98.8 F (37.1 C) Oral 72 18 98 %  02/08/24 1845 (!) 147/97 -- -- 82 -- --  02/08/24 1830 (!) 137/99 -- -- 86 -- --  02/08/24 1815 (!) 145/88 -- -- 81 -- --  02/08/24 1808 (!) 154/106 -- -- 92 -- --  02/08/24 1749 -- -- -- -- -- 98 %  02/08/24 1745 (!) 142/90 -- -- 82 -- --  02/08/24 1744 -- -- -- -- -- 99 %  02/08/24 1739 -- -- -- -- -- 98 %  02/08/24 1734 -- -- -- -- -- 99 %  02/08/24 1730 (!) 150/96 -- -- 84 --  --  02/08/24 1715 134/87 -- -- 85 -- --  02/08/24 1707 (!) 147/88 -- -- 89 -- --  02/08/24 1639 -- -- -- -- -- 99 %  02/08/24 1634 -- -- -- -- -- 98 %  02/08/24 1629 -- -- -- -- -- 98 %  02/08/24 1624 -- -- -- -- -- 98 %  02/08/24 1619 -- -- -- -- -- 97 %  02/08/24 1618 134/86 -- -- 92 17 97 %     Physical Exam Vitals and nursing note reviewed.  Constitutional:      Appearance: Normal appearance. She is normal weight.  Cardiovascular:     Rate and Rhythm: Normal rate and regular rhythm.     Heart sounds: Normal heart sounds.  Pulmonary:     Effort: Pulmonary effort is normal.     Breath sounds: Normal breath sounds.  Abdominal:     Palpations: Abdomen is soft.  Musculoskeletal:        General: Normal range of motion.  Skin:    General: Skin is warm and dry.  Neurological:     Mental Status: She is alert and oriented to person, place, and time.  Psychiatric:        Mood and Affect: Mood normal.        Behavior: Behavior normal.        Thought Content: Thought content normal.        Judgment: Judgment normal.    MAU Course  Procedures  MDM CCUA CBC CMP P/C Ratio Serial BP's   Results for orders placed or performed during the hospital encounter of 02/08/24 (from the past 24 hours)  CBC     Status: Abnormal   Collection Time: 02/08/24  5:30 PM  Result Value Ref Range   WBC 10.9 (H) 4.0 - 10.5 K/uL   RBC 5.13 (H) 3.87 - 5.11 MIL/uL   Hemoglobin 15.1 (H) 12.0 - 15.0 g/dL   HCT 54.4 63.9 - 53.9 %   MCV 88.7 80.0 - 100.0 fL   MCH 29.4 26.0 - 34.0 pg   MCHC 33.2 30.0 - 36.0 g/dL   RDW 85.7 88.4 - 84.4 %   Platelets 344  150 - 400 K/uL   nRBC 0.0 0.0 - 0.2 %  Comprehensive metabolic panel with GFR     Status: None   Collection Time: 02/08/24  5:30 PM  Result Value Ref Range   Sodium 138 135 - 145 mmol/L   Potassium 4.2 3.5 - 5.1 mmol/L   Chloride 102 98 - 111 mmol/L   CO2 23 22 - 32 mmol/L   Glucose, Bld 94 70 - 99 mg/dL   BUN 18 6 - 20 mg/dL   Creatinine,  Ser 9.16 0.44 - 1.00 mg/dL   Calcium  9.9 8.9 - 10.3 mg/dL   Total Protein 7.8 6.5 - 8.1 g/dL   Albumin 4.6 3.5 - 5.0 g/dL   AST 26 15 - 41 U/L   ALT 23 0 - 44 U/L   Alkaline Phosphatase 120 38 - 126 U/L   Total Bilirubin 0.4 0.0 - 1.2 mg/dL   GFR, Estimated >39 >39 mL/min   Anion gap 13 5 - 15      Assessment and Plan  1. Postpartum hypertension (Primary) - Information provided on postpartum HTN, how to take your blood pressure - Advised to call OB office for BP >140/90 - Prescription for: Vasotec  5 mg po daily - F/U in 2 days for BP recheck  2. Postpartum headache - Can take Flexeril  that she has at home - Also can purchase OTC Excedrin Tension H/A for milder H/As  3. Delayed postpartum hemorrhage - Given Cytotec  while in MAU - Information provided on postpartum hemorrhage - Advised that Cytotec  would cause her to bleed more and cramp more as it helps uterus expel RPOC.  - Reassurance given that bleeding and cramping will improve once all RPOC has been expelled     Ala Cart, CNM 02/08/2024, 4:49 PM      [1]  Social History Tobacco Use   Smoking status: Never   Smokeless tobacco: Never  Vaping Use   Vaping status: Never Used  Substance Use Topics   Alcohol use: Not Currently   Drug use: No  [2]  Allergies Allergen Reactions   Hydromet [Hydrocodone  Bit-Homatrop Mbr] Anaphylaxis, Itching and Swelling    Patient states that her throat closes   Morphine  And Codeine Itching and Other (See Comments)    Makes entire burn and itch SEVERELY; clawed her skin   Tessalon [Benzonatate]    Penicillins Palpitations    Has patient had a PCN reaction causing immediate rash, facial/tongue/throat swelling, SOB or lightheadedness with hypotension: Yes Has patient had a PCN reaction causing severe rash involving mucus membranes or skin necrosis: No Has patient had a PCN reaction that required hospitalization: No Has patient had a PCN reaction occurring within the last 10  years: Yes If all of the above answers are NO, then may proceed with Cephalosporin use.    "

## 2024-02-18 ENCOUNTER — Encounter (HOSPITAL_COMMUNITY)
